# Patient Record
Sex: Female | Born: 1965 | Race: White | Hispanic: No | Marital: Married | Smoking: Former smoker
Health system: Southern US, Community
[De-identification: ages and names within clinical notes are randomized; demographics above are authoritative.]

## PROBLEM LIST (undated history)

## (undated) DIAGNOSIS — D594 Other nonautoimmune hemolytic anemias: Secondary | ICD-10-CM

## (undated) DIAGNOSIS — R002 Palpitations: Secondary | ICD-10-CM

## (undated) DIAGNOSIS — O039 Complete or unspecified spontaneous abortion without complication: Secondary | ICD-10-CM

## (undated) DIAGNOSIS — N938 Other specified abnormal uterine and vaginal bleeding: Secondary | ICD-10-CM

## (undated) DIAGNOSIS — F329 Major depressive disorder, single episode, unspecified: Secondary | ICD-10-CM

## (undated) DIAGNOSIS — F32A Depression, unspecified: Secondary | ICD-10-CM

## (undated) HISTORY — DX: Palpitations: R00.2

## (undated) HISTORY — DX: Complete or unspecified spontaneous abortion without complication: O03.9

## (undated) HISTORY — PX: OTHER SURGICAL HISTORY: SHX169

## (undated) HISTORY — DX: Other specified abnormal uterine and vaginal bleeding: N93.8

## (undated) HISTORY — DX: Major depressive disorder, single episode, unspecified: F32.9

## (undated) HISTORY — DX: Depression, unspecified: F32.A

## (undated) HISTORY — DX: Other nonautoimmune hemolytic anemias: D59.4

---

## 1998-08-24 ENCOUNTER — Other Ambulatory Visit: Admission: RE | Admit: 1998-08-24 | Discharge: 1998-08-24 | Payer: Self-pay | Admitting: Family Medicine

## 1999-03-10 ENCOUNTER — Encounter: Payer: Self-pay | Admitting: Family Medicine

## 1999-03-10 ENCOUNTER — Ambulatory Visit (HOSPITAL_COMMUNITY): Admission: RE | Admit: 1999-03-10 | Discharge: 1999-03-10 | Payer: Self-pay | Admitting: Family Medicine

## 1999-10-26 ENCOUNTER — Emergency Department (HOSPITAL_COMMUNITY): Admission: EM | Admit: 1999-10-26 | Discharge: 1999-10-26 | Payer: Self-pay | Admitting: Emergency Medicine

## 2000-03-23 ENCOUNTER — Other Ambulatory Visit: Admission: RE | Admit: 2000-03-23 | Discharge: 2000-03-23 | Payer: Self-pay | Admitting: Family Medicine

## 2001-03-25 ENCOUNTER — Other Ambulatory Visit: Admission: RE | Admit: 2001-03-25 | Discharge: 2001-04-13 | Payer: Self-pay

## 2002-04-29 ENCOUNTER — Other Ambulatory Visit: Admission: RE | Admit: 2002-04-29 | Discharge: 2002-04-29 | Payer: Self-pay | Admitting: Family Medicine

## 2002-12-08 ENCOUNTER — Other Ambulatory Visit: Admission: RE | Admit: 2002-12-08 | Discharge: 2002-12-08 | Payer: Self-pay | Admitting: Obstetrics and Gynecology

## 2003-07-16 ENCOUNTER — Other Ambulatory Visit: Admission: RE | Admit: 2003-07-16 | Discharge: 2003-07-16 | Payer: Self-pay | Admitting: Family Medicine

## 2003-07-21 ENCOUNTER — Encounter: Admission: RE | Admit: 2003-07-21 | Discharge: 2003-07-21 | Payer: Self-pay | Admitting: Family Medicine

## 2004-04-27 ENCOUNTER — Ambulatory Visit: Payer: Self-pay | Admitting: Family Medicine

## 2004-04-27 ENCOUNTER — Encounter: Admission: RE | Admit: 2004-04-27 | Discharge: 2004-04-27 | Payer: Self-pay | Admitting: Family Medicine

## 2004-05-19 ENCOUNTER — Ambulatory Visit: Payer: Self-pay | Admitting: Family Medicine

## 2004-07-04 ENCOUNTER — Ambulatory Visit: Payer: Self-pay | Admitting: Family Medicine

## 2004-07-11 ENCOUNTER — Other Ambulatory Visit: Admission: RE | Admit: 2004-07-11 | Discharge: 2004-07-11 | Payer: Self-pay | Admitting: Family Medicine

## 2004-07-11 ENCOUNTER — Ambulatory Visit: Payer: Self-pay | Admitting: Family Medicine

## 2004-07-13 ENCOUNTER — Encounter: Admission: RE | Admit: 2004-07-13 | Discharge: 2004-07-13 | Payer: Self-pay | Admitting: Family Medicine

## 2004-10-05 ENCOUNTER — Ambulatory Visit: Payer: Self-pay | Admitting: Family Medicine

## 2004-12-22 ENCOUNTER — Ambulatory Visit: Payer: Self-pay | Admitting: Family Medicine

## 2004-12-23 ENCOUNTER — Encounter: Admission: RE | Admit: 2004-12-23 | Discharge: 2004-12-23 | Payer: Self-pay | Admitting: Family Medicine

## 2004-12-29 ENCOUNTER — Ambulatory Visit: Payer: Self-pay | Admitting: Family Medicine

## 2005-01-13 ENCOUNTER — Encounter: Admission: RE | Admit: 2005-01-13 | Discharge: 2005-01-13 | Payer: Self-pay | Admitting: Family Medicine

## 2005-05-09 ENCOUNTER — Ambulatory Visit: Payer: Self-pay | Admitting: Family Medicine

## 2005-05-23 ENCOUNTER — Ambulatory Visit: Payer: Self-pay | Admitting: Family Medicine

## 2005-05-29 ENCOUNTER — Ambulatory Visit: Payer: Self-pay | Admitting: Licensed Clinical Social Worker

## 2005-06-13 ENCOUNTER — Ambulatory Visit: Payer: Self-pay | Admitting: Licensed Clinical Social Worker

## 2005-06-16 ENCOUNTER — Ambulatory Visit: Payer: Self-pay | Admitting: Family Medicine

## 2005-06-26 ENCOUNTER — Encounter: Admission: RE | Admit: 2005-06-26 | Discharge: 2005-06-26 | Payer: Self-pay | Admitting: Family Medicine

## 2005-07-31 ENCOUNTER — Ambulatory Visit: Payer: Self-pay | Admitting: Family Medicine

## 2005-08-03 ENCOUNTER — Ambulatory Visit: Payer: Self-pay | Admitting: Licensed Clinical Social Worker

## 2005-08-08 ENCOUNTER — Other Ambulatory Visit: Admission: RE | Admit: 2005-08-08 | Discharge: 2005-08-08 | Payer: Self-pay | Admitting: Family Medicine

## 2005-08-08 ENCOUNTER — Encounter: Payer: Self-pay | Admitting: Family Medicine

## 2005-08-08 ENCOUNTER — Ambulatory Visit: Payer: Self-pay | Admitting: Family Medicine

## 2005-11-15 ENCOUNTER — Ambulatory Visit: Payer: Self-pay | Admitting: Family Medicine

## 2005-11-17 ENCOUNTER — Encounter: Admission: RE | Admit: 2005-11-17 | Discharge: 2005-11-17 | Payer: Self-pay | Admitting: Family Medicine

## 2005-11-20 ENCOUNTER — Ambulatory Visit: Payer: Self-pay | Admitting: Family Medicine

## 2005-11-21 ENCOUNTER — Ambulatory Visit (HOSPITAL_COMMUNITY): Admission: RE | Admit: 2005-11-21 | Discharge: 2005-11-21 | Payer: Self-pay | Admitting: Family Medicine

## 2005-12-07 ENCOUNTER — Ambulatory Visit: Payer: Self-pay | Admitting: Internal Medicine

## 2006-03-01 ENCOUNTER — Ambulatory Visit: Payer: Self-pay | Admitting: Family Medicine

## 2006-06-21 ENCOUNTER — Ambulatory Visit: Payer: Self-pay | Admitting: Family Medicine

## 2006-07-02 ENCOUNTER — Encounter: Admission: RE | Admit: 2006-07-02 | Discharge: 2006-07-02 | Payer: Self-pay | Admitting: Family Medicine

## 2006-08-03 ENCOUNTER — Ambulatory Visit: Payer: Self-pay | Admitting: Family Medicine

## 2006-08-03 LAB — CONVERTED CEMR LAB
AST: 21 units/L (ref 0–37)
Albumin: 3.9 g/dL (ref 3.5–5.2)
BUN: 13 mg/dL (ref 6–23)
Calcium: 9.5 mg/dL (ref 8.4–10.5)
Chloride: 109 meq/L (ref 96–112)
Eosinophils Absolute: 0.1 10*3/uL (ref 0.0–0.6)
Eosinophils Relative: 1.7 % (ref 0.0–5.0)
GFR calc Af Amer: 143 mL/min
GFR calc non Af Amer: 118 mL/min
HCT: 36.8 % (ref 36.0–46.0)
HDL: 91 mg/dL (ref >39.0)
LDL Cholesterol: 97 mg/dL (ref 0–99)
Lymphocytes Relative: 22.8 % (ref 12.0–46.0)
MCHC: 34 g/dL (ref 30.0–36.0)
Monocytes Absolute: 0.5 10*3/uL (ref 0.2–0.7)
Monocytes Relative: 7.9 % (ref 3.0–11.0)
Neutro Abs: 4.2 10*3/uL (ref 1.4–7.7)
Neutrophils Relative %: 66.9 % (ref 43.0–77.0)
RBC: 3.79 M/uL — ABNORMAL LOW (ref 3.87–5.11)
RDW: 12.5 % (ref 11.5–14.6)
VLDL: 8 mg/dL (ref 0–40)
WBC: 6.2 10*3/uL (ref 4.5–10.5)

## 2006-08-10 ENCOUNTER — Other Ambulatory Visit: Admission: RE | Admit: 2006-08-10 | Discharge: 2006-08-10 | Payer: Self-pay | Admitting: Family Medicine

## 2006-08-10 ENCOUNTER — Ambulatory Visit: Payer: Self-pay | Admitting: Family Medicine

## 2006-08-10 ENCOUNTER — Encounter: Payer: Self-pay | Admitting: Family Medicine

## 2006-09-21 ENCOUNTER — Encounter: Payer: Self-pay | Admitting: Family Medicine

## 2006-12-10 ENCOUNTER — Telehealth: Payer: Self-pay | Admitting: Family Medicine

## 2006-12-24 DIAGNOSIS — O039 Complete or unspecified spontaneous abortion without complication: Secondary | ICD-10-CM

## 2006-12-24 HISTORY — DX: Complete or unspecified spontaneous abortion without complication: O03.9

## 2007-01-04 ENCOUNTER — Ambulatory Visit: Payer: Self-pay | Admitting: Family Medicine

## 2007-01-04 LAB — CONVERTED CEMR LAB: Beta hcg, urine, semiquantitative: POSITIVE

## 2007-02-05 ENCOUNTER — Telehealth: Payer: Self-pay | Admitting: Family Medicine

## 2007-02-08 ENCOUNTER — Ambulatory Visit: Payer: Self-pay | Admitting: Family Medicine

## 2007-02-08 DIAGNOSIS — F339 Major depressive disorder, recurrent, unspecified: Secondary | ICD-10-CM | POA: Insufficient documentation

## 2007-02-21 ENCOUNTER — Telehealth: Payer: Self-pay | Admitting: Family Medicine

## 2007-03-07 ENCOUNTER — Ambulatory Visit: Payer: Self-pay | Admitting: Family Medicine

## 2007-03-11 ENCOUNTER — Telehealth: Payer: Self-pay | Admitting: *Deleted

## 2007-03-11 ENCOUNTER — Ambulatory Visit: Payer: Self-pay | Admitting: Family Medicine

## 2007-03-11 ENCOUNTER — Telehealth (INDEPENDENT_AMBULATORY_CARE_PROVIDER_SITE_OTHER): Payer: Self-pay | Admitting: *Deleted

## 2007-03-11 DIAGNOSIS — F438 Other reactions to severe stress: Secondary | ICD-10-CM | POA: Insufficient documentation

## 2007-03-11 DIAGNOSIS — F4389 Other reactions to severe stress: Secondary | ICD-10-CM | POA: Insufficient documentation

## 2007-03-19 ENCOUNTER — Ambulatory Visit: Payer: Self-pay | Admitting: Family Medicine

## 2007-05-15 DIAGNOSIS — R071 Chest pain on breathing: Secondary | ICD-10-CM | POA: Insufficient documentation

## 2007-06-11 ENCOUNTER — Ambulatory Visit: Payer: Self-pay | Admitting: Family Medicine

## 2007-06-13 ENCOUNTER — Telehealth: Payer: Self-pay | Admitting: Family Medicine

## 2007-06-14 ENCOUNTER — Telehealth: Payer: Self-pay | Admitting: Family Medicine

## 2007-07-30 ENCOUNTER — Ambulatory Visit: Payer: Self-pay | Admitting: Family Medicine

## 2007-07-30 LAB — CONVERTED CEMR LAB
AST: 22 units/L (ref 0–37)
Albumin: 3.9 g/dL (ref 3.5–5.2)
Alkaline Phosphatase: 31 units/L — ABNORMAL LOW (ref 39–117)
CO2: 26 meq/L (ref 19–32)
Chloride: 103 meq/L (ref 96–112)
Eosinophils Absolute: 0.1 10*3/uL (ref 0.0–0.7)
GFR calc non Af Amer: 99 mL/min
Glucose, Bld: 79 mg/dL (ref 70–99)
LDL Cholesterol: 92 mg/dL (ref 0–99)
Lymphocytes Relative: 14 % (ref 12.0–46.0)
MCHC: 33.6 g/dL (ref 30.0–36.0)
MCV: 97.1 fL (ref 78.0–100.0)
Monocytes Absolute: 0.7 10*3/uL (ref 0.1–1.0)
Monocytes Relative: 6.2 % (ref 3.0–12.0)
Neutrophils Relative %: 78.8 % — ABNORMAL HIGH (ref 43.0–77.0)
Nitrite: NEGATIVE
Platelets: 332 10*3/uL (ref 150–400)
Protein, U semiquant: NEGATIVE
RBC: 3.93 M/uL (ref 3.87–5.11)
Sodium: 136 meq/L (ref 135–145)
TSH: 1.02 microintl units/mL (ref 0.35–5.50)
Total CHOL/HDL Ratio: 2.1
Urobilinogen, UA: 0.2
pH: 7

## 2007-08-06 ENCOUNTER — Ambulatory Visit: Payer: Self-pay | Admitting: Family Medicine

## 2007-08-06 DIAGNOSIS — F329 Major depressive disorder, single episode, unspecified: Secondary | ICD-10-CM

## 2007-08-06 DIAGNOSIS — N938 Other specified abnormal uterine and vaginal bleeding: Secondary | ICD-10-CM

## 2007-08-20 ENCOUNTER — Encounter: Admission: RE | Admit: 2007-08-20 | Discharge: 2007-08-20 | Payer: Self-pay | Admitting: Family Medicine

## 2007-09-24 DIAGNOSIS — R197 Diarrhea, unspecified: Secondary | ICD-10-CM

## 2007-09-25 ENCOUNTER — Ambulatory Visit: Payer: Self-pay | Admitting: Family Medicine

## 2007-09-25 DIAGNOSIS — R05 Cough: Secondary | ICD-10-CM

## 2007-10-23 ENCOUNTER — Telehealth: Payer: Self-pay | Admitting: Family Medicine

## 2007-11-04 ENCOUNTER — Telehealth: Payer: Self-pay | Admitting: *Deleted

## 2007-11-29 ENCOUNTER — Telehealth: Payer: Self-pay | Admitting: Family Medicine

## 2008-07-08 ENCOUNTER — Ambulatory Visit: Payer: Self-pay | Admitting: Family Medicine

## 2008-07-08 DIAGNOSIS — M545 Low back pain: Secondary | ICD-10-CM

## 2008-07-08 DIAGNOSIS — M546 Pain in thoracic spine: Secondary | ICD-10-CM

## 2008-07-08 LAB — CONVERTED CEMR LAB
Calcium: 9.8 mg/dL (ref 8.4–10.5)
Chloride: 104 meq/L (ref 96–112)
Creatinine, Ser: 0.7 mg/dL (ref 0.4–1.2)
Eosinophils Relative: 1.2 % (ref 0.0–5.0)
GFR calc non Af Amer: 97.8 mL/min (ref >60)
Glucose, Bld: 90 mg/dL (ref 70–99)
HCT: 38 % (ref 36.0–46.0)
Hemoglobin: 12.9 g/dL (ref 12.0–15.0)
Lymphocytes Relative: 22.9 % (ref 12.0–46.0)
MCHC: 34 g/dL (ref 30.0–36.0)
Monocytes Relative: 7.5 % (ref 3.0–12.0)
Neutro Abs: 4.9 10*3/uL (ref 1.4–7.7)
Platelets: 286 10*3/uL (ref 150.0–400.0)
RBC: 3.86 M/uL — ABNORMAL LOW (ref 3.87–5.11)
Sed Rate: 7 mm/hr (ref 0–22)
WBC: 7.1 10*3/uL (ref 4.5–10.5)

## 2008-07-09 ENCOUNTER — Telehealth: Payer: Self-pay | Admitting: *Deleted

## 2008-07-09 ENCOUNTER — Encounter: Payer: Self-pay | Admitting: Family Medicine

## 2008-07-10 ENCOUNTER — Ambulatory Visit: Payer: Self-pay | Admitting: Family Medicine

## 2008-07-11 DIAGNOSIS — M159 Polyosteoarthritis, unspecified: Secondary | ICD-10-CM | POA: Insufficient documentation

## 2008-07-30 ENCOUNTER — Telehealth: Payer: Self-pay | Admitting: *Deleted

## 2008-08-21 ENCOUNTER — Ambulatory Visit: Payer: Self-pay | Admitting: Family Medicine

## 2008-08-21 ENCOUNTER — Encounter: Admission: RE | Admit: 2008-08-21 | Discharge: 2008-08-21 | Payer: Self-pay | Admitting: Family Medicine

## 2008-08-21 LAB — CONVERTED CEMR LAB
ALT: 16 units/L (ref 0–35)
AST: 20 units/L (ref 0–37)
Albumin: 3.9 g/dL (ref 3.5–5.2)
BUN: 10 mg/dL (ref 6–23)
Basophils Absolute: 0 10*3/uL (ref 0.0–0.1)
Blood in Urine, dipstick: NEGATIVE
Calcium: 9.4 mg/dL (ref 8.4–10.5)
Chloride: 108 meq/L (ref 96–112)
Cholesterol: 224 mg/dL — ABNORMAL HIGH (ref 0–200)
Eosinophils Absolute: 0.1 10*3/uL (ref 0.0–0.7)
Eosinophils Relative: 0.7 % (ref 0.0–5.0)
Glucose, Bld: 89 mg/dL (ref 70–99)
Glucose, Urine, Semiquant: NEGATIVE
HDL: 109.2 mg/dL (ref >39.00)
Hemoglobin: 12.9 g/dL (ref 12.0–15.0)
Lymphs Abs: 1.6 10*3/uL (ref 0.7–4.0)
MCV: 100.5 fL — ABNORMAL HIGH (ref 78.0–100.0)
Monocytes Absolute: 0.5 10*3/uL (ref 0.1–1.0)
Monocytes Relative: 7.3 % (ref 3.0–12.0)
Nitrite: NEGATIVE
Potassium: 4 meq/L (ref 3.5–5.1)
Protein, U semiquant: NEGATIVE
RBC: 3.68 M/uL — ABNORMAL LOW (ref 3.87–5.11)
TSH: 1.21 microintl units/mL (ref 0.35–5.50)
Total Bilirubin: 1.1 mg/dL (ref 0.3–1.2)
Total CHOL/HDL Ratio: 2
Triglycerides: 47 mg/dL (ref 0.0–149.0)
VLDL: 9.4 mg/dL (ref 0.0–40.0)
WBC Urine, dipstick: NEGATIVE
pH: 7.5

## 2008-08-28 ENCOUNTER — Other Ambulatory Visit: Admission: RE | Admit: 2008-08-28 | Discharge: 2008-08-28 | Payer: Self-pay | Admitting: Family Medicine

## 2008-08-28 ENCOUNTER — Encounter: Payer: Self-pay | Admitting: Family Medicine

## 2008-08-28 ENCOUNTER — Ambulatory Visit: Payer: Self-pay | Admitting: Family Medicine

## 2008-09-01 ENCOUNTER — Telehealth: Payer: Self-pay | Admitting: Family Medicine

## 2009-04-21 ENCOUNTER — Telehealth: Payer: Self-pay | Admitting: Family Medicine

## 2009-08-23 ENCOUNTER — Ambulatory Visit: Payer: Self-pay | Admitting: Family Medicine

## 2009-08-23 ENCOUNTER — Encounter: Admission: RE | Admit: 2009-08-23 | Discharge: 2009-08-23 | Payer: Self-pay | Admitting: Family Medicine

## 2009-08-23 LAB — CONVERTED CEMR LAB
ALT: 12 units/L (ref 0–35)
Alkaline Phosphatase: 35 units/L — ABNORMAL LOW (ref 39–117)
Basophils Absolute: 0 10*3/uL (ref 0.0–0.1)
Basophils Relative: 0.5 % (ref 0.0–3.0)
Bilirubin Urine: NEGATIVE
Direct LDL: 101.1 mg/dL
Eosinophils Absolute: 0.1 10*3/uL (ref 0.0–0.7)
Eosinophils Relative: 1.7 % (ref 0.0–5.0)
GFR calc non Af Amer: 97.26 mL/min (ref >60)
Glucose, Urine, Semiquant: NEGATIVE
Ketones, urine, test strip: NEGATIVE
Lymphocytes Relative: 24.7 % (ref 12.0–46.0)
Lymphs Abs: 1.6 10*3/uL (ref 0.7–4.0)
MCHC: 34.6 g/dL (ref 30.0–36.0)
MCV: 98.9 fL (ref 78.0–100.0)
Monocytes Absolute: 0.6 10*3/uL (ref 0.1–1.0)
Monocytes Relative: 8.6 % (ref 3.0–12.0)
Neutro Abs: 4.3 10*3/uL (ref 1.4–7.7)
Nitrite: NEGATIVE
Platelets: 270 10*3/uL (ref 150.0–400.0)
TSH: 1.6 microintl units/mL (ref 0.35–5.50)
Total Bilirubin: 0.5 mg/dL (ref 0.3–1.2)
Total CHOL/HDL Ratio: 2
Total Protein: 6.7 g/dL (ref 6.0–8.3)
WBC Urine, dipstick: NEGATIVE
WBC: 6.6 10*3/uL (ref 4.5–10.5)
pH: 7

## 2009-08-23 LAB — HM MAMMOGRAPHY

## 2009-09-09 ENCOUNTER — Ambulatory Visit: Payer: Self-pay | Admitting: Family Medicine

## 2009-09-09 ENCOUNTER — Other Ambulatory Visit: Admission: RE | Admit: 2009-09-09 | Discharge: 2009-09-09 | Payer: Self-pay | Admitting: Family Medicine

## 2009-09-09 DIAGNOSIS — N943 Premenstrual tension syndrome: Secondary | ICD-10-CM | POA: Insufficient documentation

## 2009-10-22 ENCOUNTER — Encounter: Admission: RE | Admit: 2009-10-22 | Discharge: 2009-10-22 | Payer: Self-pay | Admitting: Neurological Surgery

## 2009-12-09 ENCOUNTER — Telehealth: Payer: Self-pay | Admitting: Family Medicine

## 2010-02-08 ENCOUNTER — Telehealth: Payer: Self-pay | Admitting: Family Medicine

## 2010-03-01 ENCOUNTER — Ambulatory Visit: Payer: Self-pay | Admitting: Family Medicine

## 2010-03-01 DIAGNOSIS — R209 Unspecified disturbances of skin sensation: Secondary | ICD-10-CM | POA: Insufficient documentation

## 2010-03-03 ENCOUNTER — Encounter: Admission: RE | Admit: 2010-03-03 | Discharge: 2010-03-03 | Payer: Self-pay | Admitting: Family Medicine

## 2010-04-12 ENCOUNTER — Telehealth: Payer: Self-pay | Admitting: Family Medicine

## 2010-04-12 ENCOUNTER — Ambulatory Visit: Payer: Self-pay | Admitting: Family Medicine

## 2010-05-26 NOTE — Assessment & Plan Note (Signed)
Summary: pain in both hands and soles of feet/cjr   Vital Signs:  Patient profile:   45 year old female Menstrual status:  regular Weight:      113 pounds Temp:     98.1 degrees F oral BP sitting:   110 / 72  (left arm) Cuff size:   regular  Vitals Entered By: Kern Reap CMA Duncan Dull) (March 01, 2010 4:06 PM) CC: pain with neck, hands, feet, muscle weakness   CC:  pain with neck, hands, feet, and muscle weakness.  History of Present Illness: selene is a 78 -year-old single female, nonsmoker, who comes in today for evaluation of some neurologic symptoms that have yet to be explained.  It started out with neck pain.  At that juncture, we were not sure what the cause was.  We sent her for neurologic evaluation.  Neurologic evaluation including MRI of her neck was normal.  She was then referred to two [facility].  Neurologic.  She had an evaluation by Dr. Si Gaul.......... including EMGs all of which were normal.  The neurologist has come to know.  Conclusion and basically has summarily discharged her.  However, she continues to have symptoms.  Her symptom complex, including pain in the soles of her feet and her hands tingling, weakness, muscle fasciculations.  All of which coming down.  Family history negative  Allergies: 1)  Flagyl (Metronidazole) 2)  Codeine Phosphate (Codeine Phosphate)  Past History:  Past medical, surgical, family and social histories (including risk factors) reviewed for relevance to current acute and chronic problems.  Past Medical History: Reviewed history from 08/06/2007 and no changes required. PALPATATIONS MHA DUB miscarriage September 2008 Depression  Family History: Reviewed history from 08/06/2007 and no changes required. father Alzheimer's disease mother died at 25, CVA hypertensive smoker CNS aneurysm, gallbladder disease brother and sister in good health  Social History: Reviewed history from 06/11/2007 and no changes required. Single Former  Smoker Alcohol use-no Drug use-no Regular exercise-yes runs without problem  Review of Systems      See HPI  Physical Exam  General:  Well-developed,well-nourished,in no acute distress; alert,appropriate and cooperative throughout examination Msk:  No deformity or scoliosis noted of thoracic or lumbar spine.   Pulses:  R and L carotid,radial,femoral,dorsalis pedis and posterior tibial pulses are full and equal bilaterally Extremities:  No clubbing, cyanosis, edema, or deformity noted with normal full range of motion of all joints.   Neurologic:  No cranial nerve deficits noted. Station and gait are normal. Plantar reflexes are down-going bilaterally. DTRs are symmetrical throughout. Sensory, motor and coordinative functions appear intact. Psych:  Cognition and judgment appear intact. Alert and cooperative with normal attention span and concentration. No apparent delusions, illusions, hallucinations   Problems:  Medical Problems Added: 1)  Dx of Symptom, Sensory Disturbance of Skin  (ICD-782.0)  Impression & Recommendations:  Problem # 1:  SYMPTOM,  SENSORY DISTURBANCE OF SKIN (ICD-782.0) Assessment Unchanged  Orders: Radiology Referral (Radiology) Neurology Referral (Neuro)  Complete Medication List: 1)  Daily Multi Tabs (Multiple vitamins-minerals) .... Once daily 2)  Hypercare 20 % Soln (Aluminum chloride) .... Apply daily or as needed 3)  Cryselle-28 0.3-30 Mg-mcg Tabs (Norgestrel-ethinyl estradiol) .... Take one tab by mouth once daily  Patient Instructions: 1)  since her symptoms have persisted.  I would recommend that we pursue this with a neurologic evaluation at [facility].  In the meantime, I will try to get u  set up for an MRI  of the brain   Orders Added: 1)  Est. Patient Level IV [[address]109] 2)  Radiology Referral [Radiology] 3)  Neurology Referral [Neuro]

## 2010-05-26 NOTE — Progress Notes (Signed)
Summary: wants rx called  Phone Note Call from Patient Call back at Home Phone [phone]   Caller: Patient---  voice mail Summary of Call: has a yeast infection with thick discharge but with no odor. Would like an oral rx [facility] to [facility] [facility] Initial call taken by: Warnell Forester,  February 08, 2010 9:13 AM  Follow-up for Phone Call        Diflucan 1 mg, dispensed, two tablets directions one, now, one in 5 days for vaginitis, no refills Follow-up by: Roderick Pee MD,  February 08, 2010 3:52 PM  Additional Follow-up for Phone Call Additional follow up Details #1::        pt called again with status.    Additional Follow-up for Phone Call Additional follow up Details #2::    left message on machine for patient  Follow-up by: Kern Reap CMA Duncan Dull),  February 08, 2010 5:13 PM  New/Updated Medications: DIFLUCAN 100 MG TABS (FLUCONAZOLE) take one tab now and one tab in five days Prescriptions: DIFLUCAN 100 MG TABS (FLUCONAZOLE) take one tab now and one tab in five days  #2 x 0   Entered by:   Kern Reap CMA (AAMA)   Authorized by:   Roderick Pee MD   Signed by:   Kern Reap CMA (AAMA) on 02/08/2010   Method used:   Electronically to        [facility]  [phone]* (retail)       [address]       [place], [place]  [address]       Ph: [phone]       Fax: [phone]   RxID:   [id number]

## 2010-05-26 NOTE — Assessment & Plan Note (Signed)
Summary: cpx w/pap/cjr   Vital Signs:  Patient profile:   45 year old female Menstrual status:  regular LMP:     08/25/2009 Height:      58.5 inches Weight:      112 pounds BMI:     23.09 Temp:     97.9 degrees F oral BP sitting:   110 / 70  (right arm)  Vitals Entered By: Kathrynn Speed CMA (Sep 09, 2009 3:49 PM) LMP (date): 08/25/2009     Enter LMP: 08/25/2009 Last PAP Result NEGATIVE FOR INTRAEPITHELIAL LESIONS OR MALIGNANCY.   History of Present Illness: Cynthia Delgado is a 45 year old single female, who comes in today for general physical examination  She's always been in excellent, health.  She's had no chronic health problems.  She is still complaining of neck pain and shoulder pain.  We did an evaluation last year, which included x-rays, which showed a bone spur on one of her cervical vertebrae.  She's tried anti-inflammatories massage therapy.  Nothing seems to help.  Will arrange for a consult with Dr. Danielle Dess.  Neurologic review of systems negative.  She uses Drysol for hyperhidrosis.  LMP 54, regular.  There, were she's having a lot of mood changes, prior to her period, and some mild hot flushes.  Tetanus boosted 2010  Preventive Screening-Counseling & Management  Alcohol-Tobacco     Alcohol drinks/day: 2     Smoking Status: quit  Caffeine-Diet-Exercise     Caffeine use/day: 1     Does Patient Exercise: yes  Current Medications (verified): 1)  Daily Multi  Tabs (Multiple Vitamins-Minerals) .... Once Daily 2)  Hypercare 20 % Soln (Aluminum Chloride) .... Apply Daily or As Needed  Allergies (verified): 1)  Flagyl (Metronidazole) 2)  Codeine Phosphate (Codeine Phosphate)  Past History:  Past medical, surgical, family and social histories (including risk factors) reviewed, and no changes noted (except as noted below).  Past Medical History: Reviewed history from 08/06/2007 and no changes required. PALPATATIONS MHA DUB miscarriage September  2008 Depression  Family History: Reviewed history from 08/06/2007 and no changes required. father Alzheimer's disease mother died at 1, CVA hypertensive smoker CNS aneurysm, gallbladder disease brother and sister in good health  Social History: Reviewed history from 06/11/2007 and no changes required. Single Former Smoker Alcohol use-no Drug use-no Regular exercise-yes runs without problem Caffeine use/day:  1  Review of Systems      See HPI  Physical Exam  General:  Well-developed,well-nourished,in no acute distress; alert,appropriate and cooperative throughout examination Head:  Normocephalic and atraumatic without obvious abnormalities. No apparent alopecia or balding. Eyes:  No corneal or conjunctival inflammation noted. EOMI. Perrla. Funduscopic exam benign, without hemorrhages, exudates or papilledema. Vision grossly normal. Ears:  External ear exam shows no significant lesions or deformities.  Otoscopic examination reveals clear canals, tympanic membranes are intact bilaterally without bulging, retraction, inflammation or discharge. Hearing is grossly normal bilaterally. Nose:  External nasal examination shows no deformity or inflammation. Nasal mucosa are pink and moist without lesions or exudates. Mouth:  Oral mucosa and oropharynx without lesions or exudates.  Teeth in good repair. Neck:  No deformities, masses, or tenderness noted. Chest Wall:  No deformities, masses, or tenderness noted. Breasts:  diffuse fibrocystic changes, mammogram recent normal Lungs:  Normal respiratory effort, chest expands symmetrically. Lungs are clear to auscultation, no crackles or wheezes. Heart:  Normal rate and regular rhythm. S1 and S2 normal without gallop, murmur, click, rub or other extra sounds. Abdomen:  Bowel sounds positive,abdomen soft  and non-tender without masses, organomegaly or hernias noted. Rectal:  No external abnormalities noted. Normal sphincter tone. No rectal masses or  tenderness. Genitalia:  Pelvic Exam:        External: normal female genitalia without lesions or masses        Vagina: normal without lesions or masses        Cervix: normal without lesions or masses        Adnexa: normal bimanual exam without masses or fullness        Uterus: normal by palpation        Pap smear: performed Msk:  No deformity or scoliosis noted of thoracic or lumbar spine.   Pulses:  R and L carotid,radial,femoral,dorsalis pedis and posterior tibial pulses are full and equal bilaterally Extremities:  No clubbing, cyanosis, edema, or deformity noted with normal full range of motion of all joints.   Neurologic:  No cranial nerve deficits noted. Station and gait are normal. Plantar reflexes are down-going bilaterally. DTRs are symmetrical throughout. Sensory, motor and coordinative functions appear intact. Skin:  Intact without suspicious lesions or rashes Cervical Nodes:  No lymphadenopathy noted Axillary Nodes:  No palpable lymphadenopathy Inguinal Nodes:  No significant adenopathy Psych:  Cognition and judgment appear intact. Alert and cooperative with normal attention span and concentration. No apparent delusions, illusions, hallucinations   Impression & Recommendations:  Problem # 1:  Preventive Health Care (ICD-V70.0) Assessment Unchanged  Problem # 2:  BACK PAIN, THORACIC REGION (ICD-724.1) Assessment: Deteriorated  The following medications were removed from the medication list:    Darvocet A500 100-500 Mg Tabs (Propoxyphene n-apap) .Marland Kitchen... 1 or 2 by mouth at bedtime as needed pain  Orders: Prescription Created Electronically [phone]) Neurosurgeon Referral (Neurosurgeon)  Problem # 3:  PMS (ICD-625.4) Assessment: New  The following medications were removed from the medication list:    Darvocet A500 100-500 Mg Tabs (Propoxyphene n-apap) .Marland Kitchen... 1 or 2 by mouth at bedtime as needed pain    Corgard 20 Mg Tabs (Nadolol) .Marland Kitchen... Take one tab once daily  Complete  Medication List: 1)  Daily Multi Tabs (Multiple vitamins-minerals) .... Once daily 2)  Hypercare 20 % Soln (Aluminum chloride) .... Apply daily or as needed 3)  Prozac 20 Mg Caps (Fluoxetine hcl) .Marland Kitchen.. 1 tab @ bedtime  Patient Instructions: 1)  I will call Dr. Danielle Dess to  is set up a  consult for you 2)  Please schedule a follow-up appointment in 1 year. 3)  try Prozac, 20 mg nightly for 10 days prior to your. To help with the PMS symptoms Prescriptions: PROZAC 20 MG CAPS (FLUOXETINE HCL) 1 tab @ bedtime  #50 x 4   Entered and Authorized by:   Roderick Pee MD   Signed by:   Roderick Pee MD on 09/09/2009   Method used:   Electronically to        [facility]  [phone]* (retail)       [address]       [place], [place]  [address]       Ph: [phone]       Fax: [phone]   RxID:   [id number] HYPERCARE 20 % SOLN (ALUMINUM CHLORIDE) apply daily or as needed  #60cc x 6   Entered and Authorized by:   Roderick Pee MD   Signed by:   Roderick Pee MD on 09/09/2009   Method used:   Electronically to        [facility]  [phone]* (retail)  [address]       [place], [place]  [address]       Ph: [phone]       Fax: [phone]   RxID:   [id number]

## 2010-05-26 NOTE — Progress Notes (Signed)
  Phone Note Call from Patient   Caller: Patient Call For: Roderick Pee MD Summary of Call: Pt is calling back to talk to Dr. Tawanna Cooler about a spinal tap. [phone] Initial call taken by: Lynann Beaver CMA AAMA,  April 12, 2010 4:34 PM  Follow-up for Phone Call        Fleet Contras please call her tomorrow.......... whatever the the neurologist recommends that I would follow their advice since they are the experts in this area Follow-up by: Roderick Pee MD,  April 12, 2010 5:09 PM  Additional Follow-up for Phone Call Additional follow up Details #1::        Phone Call Completed Additional Follow-up by: Kern Reap CMA Duncan Dull),  April 13, 2010 1:59 PM

## 2010-05-26 NOTE — Progress Notes (Signed)
Summary: OCP  Phone Note Call from Patient   Caller: Patient Call For: Roderick Pee MD Summary of Call: Pt is asking for RX for OCP.  [facility]) [phone] Initial call taken by: Lynann Beaver CMA,  December 09, 2009 1:38 PM  Follow-up for Phone Call        rx sent and left message on machine for patient to take a asa daily  Follow-up by: Kern Reap CMA Duncan Dull),  December 09, 2009 2:42 PM    New/Updated Medications: CRYSELLE-28 0.3-30 MG-MCG TABS (NORGESTREL-ETHINYL ESTRADIOL) take one tab by mouth once daily Prescriptions: CRYSELLE-28 0.3-30 MG-MCG TABS (NORGESTREL-ETHINYL ESTRADIOL) take one tab by mouth once daily  #1 x 9   Entered by:   Kern Reap CMA (AAMA)   Authorized by:   Roderick Pee MD   Signed by:   Kern Reap CMA (AAMA) on 12/09/2009   Method used:   Electronically to        [facility]  [phone]* (retail)       [address]       [place], [place]  [address]       Ph: [phone]       Fax: [phone]   RxID:   [id number]

## 2010-05-26 NOTE — Assessment & Plan Note (Signed)
Summary: hands tingling/dm   Vital Signs:  Patient profile:   45 year old female Menstrual status:  regular Weight:      112 pounds Temp:     98.3 degrees F oral BP sitting:   104 / 70  (left arm) Cuff size:   regular  Vitals Entered By: Kern Reap CMA Duncan Dull) (April 12, 2010 12:42 PM) CC: follow-up visit, increased symptoms   CC:  follow-up visit and increased symptoms.  History of Present Illness: Cynthia Delgado is a 45 year old single female, nonsmoker, who comes in today for reevaluation of the ongoing neurologic symptoms, for which we neurology and neurosurgery have not come up with a definitive diagnosis.  Historically, she said, trouble with her back at for about 6 years, however, that tends to be lumbar disk pain.  Will come and go.  She says some epidural steroid injections.  She said some physical therapy, but that has been intermittent ongoing problem.  However, last spring, she developed some symptoms of numbness and tingling in her feet and hands.  A sensation of weakness and joint pain, mainly in her thumbs.  Evaluation by me it was negative.  Neurosurgical evaluation and consultation were unrevealing as was a neurologic exam.  EMG etc. all normal.  Her symptoms have progressed and seemed to be getting worse.  They don't wake her up at night.  She is able to function at work.  She does have an appointment to see a neurologist at [facility], but not until March.  Review of systems negative.  She only takes her BCPs.  LMP two weeks ago, normal  Allergies: 1)  Flagyl (Metronidazole) 2)  Codeine Phosphate (Codeine Phosphate)  Past History:  Past medical, surgical, family and social histories (including risk factors) reviewed, and no changes noted (except as noted below).  Past Medical History: Reviewed history from 08/06/2007 and no changes required. PALPATATIONS MHA DUB miscarriage September 2008 Depression  Family History: Reviewed history from 08/06/2007 and no  changes required. father Alzheimer's disease mother died at 51, CVA hypertensive smoker CNS aneurysm, gallbladder disease brother and sister in good health  Social History: Reviewed history from 06/11/2007 and no changes required. Single Former Smoker Alcohol use-no Drug use-no Regular exercise-yes runs without problem  Review of Systems      See HPI  Physical Exam  General:  Well-developed,well-nourished,in no acute distress; alert,appropriate and cooperative throughout examination Head:  Normocephalic and atraumatic without obvious abnormalities. No apparent alopecia or balding. Eyes:  No corneal or conjunctival inflammation noted. EOMI. Perrla. Funduscopic exam benign, without hemorrhages, exudates or papilledema. Vision grossly normal. Ears:  External ear exam shows no significant lesions or deformities.  Otoscopic examination reveals clear canals, tympanic membranes are intact bilaterally without bulging, retraction, inflammation or discharge. Hearing is grossly normal bilaterally. Nose:  External nasal examination shows no deformity or inflammation. Nasal mucosa are pink and moist without lesions or exudates. Mouth:  Oral mucosa and oropharynx without lesions or exudates.  Teeth in good repair. Neck:  No deformities, masses, or tenderness noted. Chest Wall:  No deformities, masses, or tenderness noted. Lungs:  Normal respiratory effort, chest expands symmetrically. Lungs are clear to auscultation, no crackles or wheezes. Heart:  Normal rate and regular rhythm. S1 and S2 normal without gallop, murmur, click, rub or other extra sounds. Abdomen:  Bowel sounds positive,abdomen soft and non-tender without masses, organomegaly or hernias noted. Msk:  No deformity or scoliosis noted of thoracic or lumbar spine.   Pulses:  R and L carotid,radial,femoral,dorsalis  pedis and posterior tibial pulses are full and equal bilaterally Extremities:  No clubbing, cyanosis, edema, or deformity noted  with normal full range of motion of all joints.   Neurologic:  No cranial nerve deficits noted. Station and gait are normal. Plantar reflexes are down-going bilaterally. DTRs are symmetrical throughout. Sensory, motor and coordinative functions appear intact. Skin:  Intact without suspicious lesions or rashes Cervical Nodes:  No lymphadenopathy noted Axillary Nodes:  No palpable lymphadenopathy Inguinal Nodes:  No significant adenopathy Psych:  Cognition and judgment appear intact. Alert and cooperative with normal attention span and concentration. No apparent delusions, illusions, hallucinations   Impression & Recommendations:  Problem # 1:  SYMPTOM,  SENSORY DISTURBANCE OF SKIN (ICD-782.0) Assessment Deteriorated  Complete Medication List: 1)  Daily Multi Tabs (Multiple vitamins-minerals) .... Once daily 2)  Hypercare 20 % Soln (Aluminum chloride) .... Apply daily or as needed 3)  Cryselle-28 0.3-30 Mg-mcg Tabs (Norgestrel-ethinyl estradiol) .... Take one tab by mouth once daily  Patient Instructions: 1)  I will call the neurologist, and the neurosurgeon to get a complete idea of all the studies.  They have done and then I will call you and would discuss where we  gofrom here   Orders Added: 1)  Est. Patient Level IV [[address]]

## 2010-08-15 ENCOUNTER — Other Ambulatory Visit: Payer: Self-pay | Admitting: Family Medicine

## 2010-08-15 DIAGNOSIS — Z1231 Encounter for screening mammogram for malignant neoplasm of breast: Secondary | ICD-10-CM

## 2010-08-30 ENCOUNTER — Ambulatory Visit: Payer: Self-pay

## 2010-08-31 ENCOUNTER — Ambulatory Visit: Payer: Self-pay

## 2010-09-06 ENCOUNTER — Ambulatory Visit
Admission: RE | Admit: 2010-09-06 | Discharge: 2010-09-06 | Disposition: A | Discharge status: Home / Self Care | Payer: BC Managed Care – PPO | Source: Ambulatory Visit | Attending: Family Medicine | Admitting: Family Medicine

## 2010-09-06 DIAGNOSIS — Z1231 Encounter for screening mammogram for malignant neoplasm of breast: Secondary | ICD-10-CM

## 2010-09-09 NOTE — Assessment & Plan Note (Signed)
[facility]                           GASTROENTEROLOGY OFFICE NOTE   Cynthia Delgado, Cynthia Delgado                       MRN:          [id number]  DATE:12/07/2005                            DOB:          [birth year]    REQUESTING PHYSICIAN:  Eugenio Hoes. Tawanna Cooler, MD   REASON FOR CONSULTATION:  Abdominal pain.   ASSESSMENT:  1. Vague right upper quadrant pain not characteristic of any particular      etiology by history.  Ultrasound has been negative in 2000 and 2007      (recently), i.e., no cholelithiasis.  A recent HIDA scan with ejection      fraction is unremarkable.  CBC and CMET have been unrevealing      previously.  She has gained a little bit of weight.  Perhaps there is a      pressure phenomenon from that.  Dyspepsia is possible.  Ulcer disease      does not seem likely.  There were no worrisome features.   PLAN:  1. Reassurance.  2. Hepatic function panel, amylase and lipase.  3. Trial of Zegerid 40 mg each day.  4. Follow-up in four to six weeks for reassessment.   1. She drinks regularly, probably averaging 2 glasses of wine a night at      least.  In the past few months when she was undergoing some depression,      she was drinking more heavily but has backed off.  She has completed      Celexa for depression and feels like that has resolved.  She is      cautioned about steady, regular alcohol consumption in this fashion as      it could lead to significant liver disease, though I do not think any      is present at this time.   HISTORY:  A 45 year old white woman who has had about a 10-week history of a  vague, pressure-like sensation in the upper abdomen.  It has been present  for about 10 weeks.  It can radiate to the side and back.  It is not  associated with defecation, food ingestion, urination.  She has gained about  10-12 pounds in the spring when she was on Celexa.  That has been  discontinued.  Drinking as above.  She had not  noticed an association with  that.  She feels a pressure there all the time but she is able to sleep.  GI  review of systems is otherwise negative though she does feel bloated.   PAST MEDICAL HISTORY:  1. Palpitations/dysrhythmia.  2. Depression.   MEDICATIONS:  1. Multivitamin.  2. Birth control.   DRUG ALLERGIES:  CODEINE.   FAMILY HISTORY:  Her mother had gallbladder problems.  There is no colon  cancer or other GI problems reported.   SOCIAL HISTORY:  She is single.  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  She lives alone.  Alcohol as above. No tobacco or drugs.   REVIEW OF SYSTEMS:  See medical history form for other details.  She has  some back pain and has seen Dr. Ethelene Hal for that.  The back pain is lumbar.   PHYSICAL EXAMINATION:  GENERAL:  A pleasant, petite white woman in no acute  distress.  VITAL SIGNS:  Height 4 feet 10 inches, weight 110 pounds, blood pressure  100/64, pulse 52.  HEENT:  The eyes are anicteric.  ENT:  Normal mouth and posterior pharynx.  NECK:  Supple with no thyromegaly or mass.  CHEST:  Clear.  CARDIAC:  S1, S2, no murmurs, rubs or gallops.  ABDOMEN:  Soft, perhaps some minimal right upper quadrant tenderness but is  otherwise benign without organomegaly or mass.  EXTREMITIES:  No edema.  SKIN:  Tanned.  There is no acute rash.  LYMPHATIC:  No neck or supraclavicular nodes.   LABORATORY DATA:  CBC April 9 was normal.  CMET normal except for an  alkaline phosphatase that was low at 37.  Her lipids were normal on April 9  as well.  TSH normal.   Ultrasound findings and HIDA scan findings as described above.   I appreciate the opportunity to care for this patient.                                   Iva Boop, MD, [facility]   CEG/MedQ  DD:  12/07/2005  DT:  12/08/2005  Job #:  [id number]   cc:   Tinnie Gens A. Tawanna Cooler, MD

## 2010-09-20 ENCOUNTER — Other Ambulatory Visit (INDEPENDENT_AMBULATORY_CARE_PROVIDER_SITE_OTHER): Payer: BC Managed Care – PPO

## 2010-09-20 DIAGNOSIS — Z0000 Encounter for general adult medical examination without abnormal findings: Secondary | ICD-10-CM

## 2010-09-20 LAB — POCT URINALYSIS DIPSTICK
Blood, UA: NEGATIVE
Protein, UA: NEGATIVE
Spec Grav, UA: 1.005
Urobilinogen, UA: 0.2

## 2010-09-20 LAB — HEPATIC FUNCTION PANEL
AST: 22 U/L (ref 0–37)
Total Bilirubin: 0.5 mg/dL (ref 0.3–1.2)

## 2010-09-20 LAB — BASIC METABOLIC PANEL
BUN: 13 mg/dL (ref 6–23)
Creatinine, Ser: 0.7 mg/dL (ref 0.4–1.2)
GFR: 93.68 mL/min (ref >60.00)
Glucose, Bld: 85 mg/dL (ref 70–99)
Potassium: 5 mEq/L (ref 3.5–5.1)

## 2010-09-20 LAB — CBC WITH DIFFERENTIAL/PLATELET
Basophils Absolute: 0 10*3/uL (ref 0.0–0.1)
HCT: 38.8 % (ref 36.0–46.0)
Lymphs Abs: 1.5 10*3/uL (ref 0.7–4.0)
Monocytes Absolute: 0.5 10*3/uL (ref 0.1–1.0)
Monocytes Relative: 7.9 % (ref 3.0–12.0)
Platelets: 290 10*3/uL (ref 150.0–400.0)
RDW: 13.9 % (ref 11.5–14.6)

## 2010-09-20 LAB — LIPID PANEL
Cholesterol: 193 mg/dL (ref 0–200)
LDL Cholesterol: 100 mg/dL — ABNORMAL HIGH (ref 0–99)
Triglycerides: 59 mg/dL (ref 0.0–149.0)
VLDL: 11.8 mg/dL (ref 0.0–40.0)

## 2010-09-23 ENCOUNTER — Other Ambulatory Visit: Payer: Self-pay | Admitting: Family Medicine

## 2010-09-26 ENCOUNTER — Encounter: Payer: Self-pay | Admitting: Family Medicine

## 2010-09-27 ENCOUNTER — Other Ambulatory Visit (HOSPITAL_COMMUNITY)
Admission: RE | Admit: 2010-09-27 | Discharge: 2010-09-27 | Disposition: A | Discharge status: Home / Self Care | Payer: BC Managed Care – PPO | Source: Ambulatory Visit | Attending: Family Medicine | Admitting: Family Medicine

## 2010-09-27 ENCOUNTER — Ambulatory Visit (INDEPENDENT_AMBULATORY_CARE_PROVIDER_SITE_OTHER): Payer: BC Managed Care – PPO | Admitting: Family Medicine

## 2010-09-27 ENCOUNTER — Encounter: Payer: Self-pay | Admitting: Family Medicine

## 2010-09-27 DIAGNOSIS — N6019 Diffuse cystic mastopathy of unspecified breast: Secondary | ICD-10-CM

## 2010-09-27 DIAGNOSIS — E039 Hypothyroidism, unspecified: Secondary | ICD-10-CM

## 2010-09-27 DIAGNOSIS — Z01419 Encounter for gynecological examination (general) (routine) without abnormal findings: Secondary | ICD-10-CM

## 2010-09-27 DIAGNOSIS — I499 Cardiac arrhythmia, unspecified: Secondary | ICD-10-CM

## 2010-09-27 DIAGNOSIS — N943 Premenstrual tension syndrome: Secondary | ICD-10-CM

## 2010-09-27 MED ORDER — ALUMINUM CHLORIDE 20 % EX SOLN: Freq: Every day | CUTANEOUS | Status: DC | PRN

## 2010-09-27 MED ORDER — NADOLOL 20 MG PO TABS: 20.0000 mg | ORAL_TABLET | Freq: Every day | ORAL | Status: DC

## 2010-09-27 MED ORDER — ETHYNODIOL DIAC-ETH ESTRADIOL 1-50 MG-MCG PO TABS: 1.0000 | ORAL_TABLET | Freq: Every day | ORAL | Status: DC

## 2010-09-27 NOTE — Progress Notes (Signed)
  Subjective:    Patient ID: Cynthia Delgado Reason, female    DOB: 07/12/1965, 45 y.o.   MRN: [id number]  HPI  Selene Is a delightful, 45 year old single female, nonsmoker, who comes in for physical examination because of worsening perimenopausal symptoms, rapid heart rate,  She is currently on low dose BCPs however, she is having breakthrough symptoms of early menopause with dry skin.  Dry.  Vagina, hot flashes and mood changes.  We discussed various options will increase the dose of her BCPs for now.  She saw 7 recurrent episodes of cardiac arrhythmia.  In the past.  We treated it with Corgard 20 mg daily, which abated the arrhythmia.  She stopped it and skipping his back.  It can occur up to 10 times per day.  Only lasts for short periods of time.  No cardiac symptoms.  Previously noted PVCs.  EKG pending.  Mother died at age 12 of a stroke from underlying hypertension.  Sister went through early menopause.  She is currently being followed at [facility] for cervical disk disease    Review of Systems  Constitutional: Negative.   HENT: Negative.   Eyes: Negative.   Respiratory: Negative.   Cardiovascular: Negative.   Gastrointestinal: Negative.   Genitourinary: Negative.   Musculoskeletal: Negative.   Neurological: Negative.   Hematological: Negative.   Psychiatric/Behavioral: Negative.        Objective:   Physical Exam  Constitutional: She appears well-developed and well-nourished.  HENT:  Head: Normocephalic and atraumatic.  Right Ear: External ear normal.  Left Ear: External ear normal.  Nose: Nose normal.  Mouth/Throat: Oropharynx is clear and moist.  Eyes: EOM are normal. Pupils are equal, round, and reactive to light.  Neck: Normal range of motion. Neck supple. No thyromegaly present.  Cardiovascular: Normal rate, regular rhythm, normal heart sounds and intact distal pulses.  Exam reveals no gallop and no friction rub.   No murmur  heard. Pulmonary/Chest: Effort normal and breath sounds normal.  Abdominal: Soft. Bowel sounds are normal. She exhibits no distension and no mass. There is no tenderness. There is no rebound.  Genitourinary: Vagina normal and uterus normal. Guaiac negative stool. No vaginal discharge found.       Bilateral breast exam shows multiple fibrocystic-type lesions throughout both breasts.  No dominant lesion  Musculoskeletal: Normal range of motion.  Lymphadenopathy:    She has no cervical adenopathy.  Neurological: She is alert. She has normal reflexes. No cranial nerve deficit. She exhibits normal muscle tone. Coordination normal.  Skin: Skin is warm and dry.  Psychiatric: She has a normal mood and affect. Her behavior is normal. Judgment and thought content normal.          Assessment & Plan:  ,Premature menopause,,,,,,, increase BCPs, taken continuously follow-up in two months.  PVCs.  Restart Corgard 20 mg daily.  Cervical disk disease.  Follow-up in [facility]

## 2010-09-27 NOTE — Patient Instructions (Signed)
Let's increase the dose of the BCPs to see if we can decrease  y  symptoms.  Take one daily for 21 days, then take the first pill of the next pack on day 22.  That way.  y will  only have a period every 63 days.  Restart the Corgard 20 mg daily.  Follow-up in 4 weeks

## 2010-10-03 NOTE — Progress Notes (Signed)
Addended by: Haze Boyden on: 10/03/2010 01:27 PM   Modules accepted: Orders

## 2010-10-03 NOTE — ED Provider Notes (Signed)
Order(s) created erroneously. Erroneous order ID: [id number] Order moved by: GRAVES, Cheryl Stabenow Order move date/time: 10/03/2010  1:04 PM Source Patient:    [id number] Source Contact: 09/27/2010 Destination Patient:    [id number] Destination Contact: 09/27/2010

## 2010-10-27 ENCOUNTER — Ambulatory Visit: Payer: BC Managed Care – PPO | Admitting: Family Medicine

## 2010-11-01 ENCOUNTER — Encounter: Payer: Self-pay | Admitting: Family Medicine

## 2011-02-21 ENCOUNTER — Telehealth: Payer: Self-pay | Admitting: *Deleted

## 2011-02-21 NOTE — Telephone Encounter (Addendum)
Pt. Has started back having palpitations on the Corgard 20 mg. X one month.  Asking for suggestions. Correction made from Crestor to Corgard.

## 2011-02-21 NOTE — Telephone Encounter (Signed)
Spoke with patient.

## 2011-02-21 NOTE — Telephone Encounter (Signed)
Cynthia Delgado please call........... She is on Corgard, not Crestor.........Marland Kitchen Recommend she take a full tablet at bedtime, and a half a tablet in the morning and if this is not control her symptoms.  She needs to come in for an office visit

## 2011-04-10 ENCOUNTER — Ambulatory Visit: Payer: BC Managed Care – PPO | Admitting: Family Medicine

## 2011-04-10 ENCOUNTER — Telehealth: Payer: Self-pay

## 2011-04-10 NOTE — Telephone Encounter (Signed)
Spoke with patient and she is unable to come in until tomorrow.  Appointment made.

## 2011-04-10 NOTE — Telephone Encounter (Signed)
Pt states she was seen in June and told Dr. Tawanna Cooler about her heart skipping beats. Pt states she was put on Corgard. Per pt after a while the medication stopped working and pt's dose was increased.  Per pt she is beginning to have increased skipping and would like to be seen. Pls advise

## 2011-04-10 NOTE — Telephone Encounter (Signed)
Okay 4:30.  Left message on machine for patient.

## 2011-04-11 ENCOUNTER — Encounter: Payer: Self-pay | Admitting: Family Medicine

## 2011-04-11 ENCOUNTER — Ambulatory Visit (INDEPENDENT_AMBULATORY_CARE_PROVIDER_SITE_OTHER): Payer: BC Managed Care – PPO | Admitting: Family Medicine

## 2011-04-11 VITALS — BP 102/68 | Temp 98.0°F | Wt 117.0 lb

## 2011-04-11 DIAGNOSIS — I499 Cardiac arrhythmia, unspecified: Secondary | ICD-10-CM

## 2011-04-11 MED ORDER — NADOLOL 20 MG PO TABS: ORAL_TABLET | ORAL | Status: DC

## 2011-04-11 NOTE — Patient Instructions (Signed)
Increase the Corgard, and take one twice daily.  We will set you up for a cardiac evaluation

## 2011-04-11 NOTE — Progress Notes (Signed)
  Subjective:    Patient ID: Cynthia Delgado, female    DOB: Mar 18, 1966, 45 y.o.   MRN: [id number]  HPI Cynthia Delgado  Is a 45 year old single female, nonsmoker, who comes in today for evaluation of palpitations,  She's had episodes of rapid heart rate off and on for the past 9 years.  Would never determine an etiology.  We've gone on a caffeine free diet, which has not helped.Initially, on 20 mg of Corgard, her symptoms were minimal, however, about 6 weeks ago.  They increased.  We therefore increased her Corgard to 30 mg daily, however, she still having episodes of rapid heart rate.  No chest pain.  She is able to exercise on a regular basis without any discomfort   Review of Systems    Cardiopulmonary is systems negative Objective:   Physical Exam Well-developed well-nourished, female, in no acute distress.  Examination of the heart shows the PMI to be in the fifth intercostal space in the midclavicular line.  No heaves or thrills.  S1, S2, within normal limits.  S2 is physiologically split.  I can appreciate no murmur, rubs, or gallops.  Heart rate nail is 70 and regular       Assessment & Plan:  Episodes of rapid heart rate.  Plan increase beta-blocker to 20 mg b.i.d. Holter monitor

## 2011-04-12 ENCOUNTER — Other Ambulatory Visit: Payer: Self-pay | Admitting: Family Medicine

## 2011-04-12 DIAGNOSIS — I499 Cardiac arrhythmia, unspecified: Secondary | ICD-10-CM

## 2011-04-21 ENCOUNTER — Encounter (INDEPENDENT_AMBULATORY_CARE_PROVIDER_SITE_OTHER): Payer: 59

## 2011-04-21 DIAGNOSIS — I499 Cardiac arrhythmia, unspecified: Secondary | ICD-10-CM

## 2011-04-21 DIAGNOSIS — R002 Palpitations: Secondary | ICD-10-CM

## 2011-05-09 ENCOUNTER — Telehealth: Payer: Self-pay | Admitting: *Deleted

## 2011-05-09 NOTE — Telephone Encounter (Signed)
Pt would like Holter Monitor results, please.

## 2011-05-09 NOTE — Telephone Encounter (Signed)
Patient is aware 

## 2011-05-09 NOTE — Telephone Encounter (Signed)
Cynthia Delgado please call her monitor showed a very infrequent PACs and which is not clinically significant.  However, going forward.  She may notice an occasional skip.  The medications to avoid that are caffeine and if the skipping gets worse we can always put her on medication, but, since it's not a significant problem.  I would not recommend any medication at this time

## 2011-06-30 ENCOUNTER — Ambulatory Visit: Payer: 59 | Admitting: Family

## 2011-06-30 ENCOUNTER — Other Ambulatory Visit: Payer: Self-pay | Admitting: Family

## 2011-06-30 ENCOUNTER — Ambulatory Visit (INDEPENDENT_AMBULATORY_CARE_PROVIDER_SITE_OTHER): Payer: 59 | Admitting: Family

## 2011-06-30 ENCOUNTER — Encounter: Payer: Self-pay | Admitting: Family

## 2011-06-30 VITALS — BP 108/60 | Temp 98.6°F | Wt 116.0 lb

## 2011-06-30 DIAGNOSIS — A499 Bacterial infection, unspecified: Secondary | ICD-10-CM

## 2011-06-30 DIAGNOSIS — N760 Acute vaginitis: Secondary | ICD-10-CM

## 2011-06-30 DIAGNOSIS — B9689 Other specified bacterial agents as the cause of diseases classified elsewhere: Secondary | ICD-10-CM

## 2011-06-30 DIAGNOSIS — N898 Other specified noninflammatory disorders of vagina: Secondary | ICD-10-CM

## 2011-06-30 MED ORDER — CLINDAMYCIN HCL 300 MG PO CAPS: 300.0000 mg | ORAL_CAPSULE | Freq: Two times a day (BID) | ORAL | Status: AC

## 2011-06-30 NOTE — Patient Instructions (Signed)
Bacterial Vaginosis Bacterial vaginosis (BV) is a vaginal infection where the normal balance of bacteria in the vagina is disrupted. The normal balance is then replaced by an overgrowth of certain bacteria. There are several different kinds of bacteria that can cause BV. BV is the most common vaginal infection in women of childbearing age. CAUSES   The cause of BV is not fully understood. BV develops when there is an increase or imbalance of harmful bacteria.   Some activities or behaviors can upset the normal balance of bacteria in the vagina and put women at increased risk including:   Having a new sex partner or multiple sex partners.   Douching.   Using an intrauterine device (IUD) for contraception.   It is not clear what role sexual activity plays in the development of BV. However, women that have never had sexual intercourse are rarely infected with BV.  Women do not get BV from toilet seats, bedding, swimming pools or from touching objects around them.  SYMPTOMS   Grey vaginal discharge.   A fish-like odor with discharge, especially after sexual intercourse.   Itching or burning of the vagina and vulva.   Burning or pain with urination.   Some women have no signs or symptoms at all.  DIAGNOSIS  Your caregiver must examine the vagina for signs of BV. Your caregiver will perform lab tests and look at the sample of vaginal fluid through a microscope. They will look for bacteria and abnormal cells (clue cells), a pH test higher than 4.5, and a positive amine test all associated with BV.  RISKS AND COMPLICATIONS   Pelvic inflammatory disease (PID).   Infections following gynecology surgery.   Developing HIV.   Developing herpes virus.  TREATMENT  Sometimes BV will clear up without treatment. However, all women with symptoms of BV should be treated to avoid complications, especially if gynecology surgery is planned. Female partners generally do not need to be treated. However,  BV may spread between female sex partners so treatment is helpful in preventing a recurrence of BV.   BV may be treated with antibiotics. The antibiotics come in either pill or vaginal cream forms. Either can be used with nonpregnant or pregnant women, but the recommended dosages differ. These antibiotics are not harmful to the baby.   BV can recur after treatment. If this happens, a second round of antibiotics will often be prescribed.   Treatment is important for pregnant women. If not treated, BV can cause a premature delivery, especially for a pregnant woman who had a premature birth in the past. All pregnant women who have symptoms of BV should be checked and treated.   For chronic reoccurrence of BV, treatment with a type of prescribed gel vaginally twice a week is helpful.  HOME CARE INSTRUCTIONS   Finish all medication as directed by your caregiver.   Do not have sex until treatment is completed.   Tell your sexual partner that you have a vaginal infection. They should see their caregiver and be treated if they have problems, such as a mild rash or itching.   Practice safe sex. Use condoms. Only have 1 sex partner.  PREVENTION  Basic prevention steps can help reduce the risk of upsetting the natural balance of bacteria in the vagina and developing BV:  Do not have sexual intercourse (be abstinent).   Do not douche.   Use all of the medicine prescribed for treatment of BV, even if the signs and symptoms go away.     Tell your sex partner if you have BV. That way, they can be treated, if needed, to prevent reoccurrence.  SEEK MEDICAL CARE IF:   Your symptoms are not improving after 3 days of treatment.   You have increased discharge, pain, or fever.  MAKE SURE YOU:   Understand these instructions.   Will watch your condition.   Will get help right away if you are not doing well or get worse.  FOR MORE INFORMATION  Division of STD Prevention (DSTDP), Centers for Disease  Control and Prevention: www.cdc.gov/std American Social Health Association (ASHA): www.ashastd.org  Document Released: 04/10/2005 Document Revised: 03/30/2011 Document Reviewed: 10/01/2008 Exit[employer] Patient Information 2012 [employer]. 

## 2011-06-30 NOTE — Progress Notes (Signed)
  Subjective:    Patient ID: Cynthia Delgado, female    DOB: [birth year], 46 y.o.   MRN: [id number]  HPI 46 year old white female, nonsmoker, patient of Dr. Tawanna Cooler is in today with complaints of vaginal discharge has been going on for about 3 days. She noticed a discharge shortly after her menstrual cycle. She denies any foul-smell. Denies any concerns of sexually transmitted diseases.  Review of Systems  Constitutional: Negative.   Respiratory: Negative.   Cardiovascular: Negative.   Genitourinary: Positive for vaginal discharge. Negative for urgency, hematuria, vaginal pain and pelvic pain.  Musculoskeletal: Negative.   Psychiatric/Behavioral: Negative.    Past Medical History  Diagnosis Date  . MHA (microangiopathic hemolytic anemia)   . DUB (dysfunctional uterine bleeding)   . Miscarriage Sept 2008  . Depression   . Palpitations     History   Social History  . Marital Status: Single    Spouse Name: N/A    Number of Children: N/A  . Years of Education: N/A   Occupational History  . Not on file.   Social History Main Topics  . Smoking status: Former Games developer  . Smokeless tobacco: Not on file  . Alcohol Use:   . Drug Use:   . Sexually Active:    Other Topics Concern  . Not on file   Social History Narrative  . No narrative on file    No past surgical history on file.  Family History  Problem Relation Age of Onset  . Hypertension Mother   . Alzheimer's disease Father     Allergies  Allergen Reactions  . Codeine Phosphate     REACTION: side effects  . Metronidazole     REACTION: nausea,vomiting,diarrhea    Current Outpatient Prescriptions on File Prior to Visit  Medication Sig Dispense Refill  . aluminum chloride (DRYSOL) 20 % external solution Apply topically daily as needed.  60 mL  3  . ethynodiol-ethinyl estradiol (ZOVIA 1/50E, 28,) 1-50 MG-MCG per tablet Take 1 tablet by mouth daily.  90 tablet  3  . multivitamin (THERAGRAN) per tablet Take 1 tablet by  mouth daily.        . nadolol (CORGARD) 20 MG tablet One p.o. B.i.d.  200 tablet  3    BP 108/60  Temp(Src) 98.6 F (37 C) (Oral)  Wt 116 lb (52.617 kg)chart  Objective:   Physical Exam  Constitutional: She appears well-developed and well-nourished.  Pulmonary/Chest: Effort normal.  Genitourinary: Vaginal discharge found.       Positive whiff test. Frothy white discharge.  Neurological: She is alert.  Skin: Skin is warm and dry.  Psychiatric: She has a normal mood and affect.          Assessment & Plan:  Assessment: Vaginal discharge, bacterial vaginosis   Plan: Clindamycin 300 mg twice a day x7 days. Instructions given on bacterial vaginosis. Patient probably office with any questions or concerns. Recheck with Dr. Tawanna Cooler as scheduled or sooner when necessary. He is

## 2011-07-04 LAB — WET PREP BY MOLECULAR PROBE
Gardnerella vaginalis: POSITIVE — AB
Trichomonas vaginosis: NEGATIVE

## 2011-08-15 ENCOUNTER — Other Ambulatory Visit: Payer: Self-pay | Admitting: Family Medicine

## 2011-08-15 DIAGNOSIS — Z1231 Encounter for screening mammogram for malignant neoplasm of breast: Secondary | ICD-10-CM

## 2011-08-20 ENCOUNTER — Other Ambulatory Visit: Payer: Self-pay | Admitting: Family Medicine

## 2011-09-08 ENCOUNTER — Ambulatory Visit
Admission: RE | Admit: 2011-09-08 | Discharge: 2011-09-08 | Disposition: A | Discharge status: Home / Self Care | Payer: 59 | Source: Ambulatory Visit | Attending: Family Medicine | Admitting: Family Medicine

## 2011-09-08 DIAGNOSIS — Z1231 Encounter for screening mammogram for malignant neoplasm of breast: Secondary | ICD-10-CM

## 2011-09-21 ENCOUNTER — Other Ambulatory Visit: Payer: Self-pay | Admitting: Family Medicine

## 2011-10-04 ENCOUNTER — Encounter: Payer: Self-pay | Admitting: Family Medicine

## 2011-10-04 ENCOUNTER — Ambulatory Visit (INDEPENDENT_AMBULATORY_CARE_PROVIDER_SITE_OTHER): Payer: 59 | Admitting: Family Medicine

## 2011-10-04 VITALS — BP 110/76 | Temp 98.5°F | Wt 113.0 lb

## 2011-10-04 DIAGNOSIS — L0591 Pilonidal cyst without abscess: Secondary | ICD-10-CM

## 2011-10-04 DIAGNOSIS — K645 Perianal venous thrombosis: Secondary | ICD-10-CM | POA: Insufficient documentation

## 2011-10-04 MED ORDER — HYDROCORTISONE 2.5 % RE CREA: TOPICAL_CREAM | Freq: Two times a day (BID) | RECTAL | Status: AC

## 2011-10-04 NOTE — Patient Instructions (Signed)
Warm soaks twice daily and apply the Anusol cream until the hemorrhoid heals  Daily stool softeners  Return in August for your annual exam sooner if any problems  The other lesion is a pilonidal cyst

## 2011-10-04 NOTE — Progress Notes (Signed)
  Subjective:    Patient ID: Cynthia Delgado, female    DOB: 1965/07/03, 46 y.o.   MRN: [id number]  HPI Cynthia Delgado is a 46 year old single female who comes in today for evaluation of a lump around her rectum  She only has a bowel movement about every 2-3 days and has to take a stool softener to do that. This is been gone on for years. Recently she noticed a lump and some irritation around her rectum.  About 6 weeks ago she had tenderness at the tip [place] bone noticed a lump in and drain spontaneously. This happened once prior when she was about 46 years of age.   Review of Systems General and GI review of systems otherwise negative will well-developed well-nourished female in no acute distress examination rectum shows a 10 mm x 10 mm thrombosed hemorrhoid    Objective:   Physical Exam Well-developed well nourished female no acute distress examination rectum shows a 10 mm x 10 mm thrombosed hemorrhoid also a dimple at the end of her tailbone consistent with a pallor vital cyst       Assessment & Plan:  Thrombosed hemorrhoid plan stool softeners Anusol-HC return when necessary  Pilonidal  cyst ,,,,,,,,,,,,,,asymptomatic now observe

## 2011-11-13 ENCOUNTER — Other Ambulatory Visit: Payer: Self-pay | Admitting: Family Medicine

## 2011-11-21 ENCOUNTER — Other Ambulatory Visit (INDEPENDENT_AMBULATORY_CARE_PROVIDER_SITE_OTHER): Payer: 59

## 2011-11-21 DIAGNOSIS — Z0000 Encounter for general adult medical examination without abnormal findings: Secondary | ICD-10-CM

## 2011-11-21 LAB — CBC WITH DIFFERENTIAL/PLATELET
Basophils Relative: 0.8 % (ref 0.0–3.0)
Eosinophils Relative: 4.6 % (ref 0.0–5.0)
HCT: 36.5 % (ref 36.0–46.0)
MCV: 99.3 fl (ref 78.0–100.0)
Monocytes Absolute: 0.6 10*3/uL (ref 0.1–1.0)
Monocytes Relative: 7.6 % (ref 3.0–12.0)
Neutrophils Relative %: 64 % (ref 43.0–77.0)
RBC: 3.68 Mil/uL — ABNORMAL LOW (ref 3.87–5.11)
WBC: 8.1 10*3/uL (ref 4.5–10.5)

## 2011-11-21 LAB — HEPATIC FUNCTION PANEL
ALT: 16 U/L (ref 0–35)
AST: 19 U/L (ref 0–37)
Bilirubin, Direct: 0 mg/dL (ref 0.0–0.3)
Total Protein: 6.2 g/dL (ref 6.0–8.3)

## 2011-11-21 LAB — POCT URINALYSIS DIPSTICK
Bilirubin, UA: NEGATIVE
Blood, UA: NEGATIVE
Glucose, UA: NEGATIVE
Leukocytes, UA: NEGATIVE
Nitrite, UA: NEGATIVE

## 2011-11-21 LAB — TSH: TSH: 1.46 u[IU]/mL (ref 0.35–5.50)

## 2011-11-21 LAB — BASIC METABOLIC PANEL
Chloride: 102 mEq/L (ref 96–112)
Creatinine, Ser: 0.6 mg/dL (ref 0.4–1.2)
Potassium: 4.2 mEq/L (ref 3.5–5.1)

## 2011-11-21 LAB — LIPID PANEL
Total CHOL/HDL Ratio: 2
Triglycerides: 92 mg/dL (ref 0.0–149.0)

## 2011-12-04 ENCOUNTER — Encounter: Payer: 59 | Admitting: Family Medicine

## 2011-12-05 ENCOUNTER — Other Ambulatory Visit (HOSPITAL_COMMUNITY)
Admission: RE | Admit: 2011-12-05 | Discharge: 2011-12-05 | Disposition: A | Discharge status: Home / Self Care | Payer: 59 | Source: Ambulatory Visit | Attending: Family Medicine | Admitting: Family Medicine

## 2011-12-05 ENCOUNTER — Encounter: Payer: Self-pay | Admitting: Family Medicine

## 2011-12-05 ENCOUNTER — Ambulatory Visit (INDEPENDENT_AMBULATORY_CARE_PROVIDER_SITE_OTHER): Payer: 59 | Admitting: Family Medicine

## 2011-12-05 VITALS — BP 102/64 | Temp 98.3°F | Ht 59.25 in | Wt 112.0 lb

## 2011-12-05 DIAGNOSIS — Z01419 Encounter for gynecological examination (general) (routine) without abnormal findings: Secondary | ICD-10-CM

## 2011-12-05 DIAGNOSIS — N949 Unspecified condition associated with female genital organs and menstrual cycle: Secondary | ICD-10-CM

## 2011-12-05 DIAGNOSIS — N6019 Diffuse cystic mastopathy of unspecified breast: Secondary | ICD-10-CM

## 2011-12-05 DIAGNOSIS — I499 Cardiac arrhythmia, unspecified: Secondary | ICD-10-CM

## 2011-12-05 DIAGNOSIS — Z0000 Encounter for general adult medical examination without abnormal findings: Secondary | ICD-10-CM

## 2011-12-05 MED ORDER — NADOLOL 20 MG PO TABS: 20.0000 mg | ORAL_TABLET | Freq: Two times a day (BID) | ORAL | Status: DC | PRN

## 2011-12-05 MED ORDER — ETHYNODIOL DIAC-ETH ESTRADIOL 1-50 MG-MCG PO TABS: 1.0000 | ORAL_TABLET | Freq: Every day | ORAL | Status: DC

## 2011-12-05 NOTE — Progress Notes (Signed)
  Subjective:    Patient ID: Cynthia Delgado, female    DOB: 06/23/1965, 46 y.o.   MRN: [id number]  HPI Cynthia Delgado is a 46 year old single female nonsmoker soon to be married in September,,,,,, honeymoon in [place],, she has a history of dysfunction uterine bleeding for which she takes Corgard 20 mg twice a day  She has a history of dysfunction uterine bleeding for which she takes BCPs    She's had a history recently of migraine headaches which were cluster last about a week and went away. She's also had 3 episodes of vertigo but only lasts a few seconds and went away. No hearing loss etc.    Review of Systems  Constitutional: Negative.   HENT: Negative.   Eyes: Negative.   Respiratory: Negative.   Cardiovascular: Negative.   Gastrointestinal: Negative.   Genitourinary: Negative.   Musculoskeletal: Negative.   Neurological: Negative.   Hematological: Negative.   Psychiatric/Behavioral: Negative.        Objective:   Physical Exam  Constitutional: She appears well-developed and well-nourished.  HENT:  Head: Normocephalic and atraumatic.  Right Ear: External ear normal.  Left Ear: External ear normal.  Nose: Nose normal.  Mouth/Throat: Oropharynx is clear and moist.  Eyes: EOM are normal. Pupils are equal, round, and reactive to light.  Neck: Normal range of motion. Neck supple. No thyromegaly present.  Cardiovascular: Normal rate, regular rhythm, normal heart sounds and intact distal pulses.  Exam reveals no gallop and no friction rub.   No murmur heard. Pulmonary/Chest: Effort normal and breath sounds normal.  Abdominal: Soft. Bowel sounds are normal. She exhibits no distension and no mass. There is no tenderness. There is no rebound.  Genitourinary: Vagina normal and uterus normal. Guaiac negative stool. No vaginal discharge found.       Bilateral breast exam shows multiple small fibrocystic changes recent mammogram normal  Musculoskeletal: Normal range of motion.    Lymphadenopathy:    She has no cervical adenopathy.  Neurological: She is alert. She has normal reflexes. No cranial nerve deficit. She exhibits normal muscle tone. Coordination normal.  Skin: Skin is warm and dry.  Psychiatric: She has a normal mood and affect. Her behavior is normal. Judgment and thought content normal.          Assessment & Plan:   healthy female  Dysfunction uterine bleeding continue BCPs  Palpitations continue Corgard 20 mg twice a day  Cluster migraine resolve spontaneously  Vertigo resolved spontaneously

## 2011-12-05 NOTE — Patient Instructions (Signed)
Continue your current medications  Followup in 1 year sooner if any problems 

## 2012-01-22 ENCOUNTER — Telehealth: Payer: Self-pay | Admitting: Family Medicine

## 2012-01-22 DIAGNOSIS — I82409 Acute embolism and thrombosis of unspecified deep veins of unspecified lower extremity: Secondary | ICD-10-CM

## 2012-01-22 NOTE — Telephone Encounter (Signed)
Please schedule

## 2012-01-22 NOTE — Telephone Encounter (Signed)
[facility] called and said that pt was in for DVT. Pt will be started on coumadin with a lovenox bridge. Pt was admitted on 01/21/12 and will be discharged 01/22/12. The discharging doctor is wanting pt to come in for PT/INR on Thurs. Pls order labs.

## 2012-01-22 NOTE — Telephone Encounter (Signed)
Left message to notify pt to call office to schedule coumadin clinic appt

## 2012-01-24 ENCOUNTER — Telehealth: Payer: Self-pay | Admitting: Family Medicine

## 2012-01-24 NOTE — Telephone Encounter (Signed)
Caller: Bethani/Patient; Patient Name: Cynthia Delgado; PCP: Kelle Darting [facility]); Best Callback Phone Number: [phone]; Reason for call: Other Ws released from hospital 10-1 after being diagnosed with DVT. Is taking Lovenox and Coumadin. Has "tingling"  on right side of face 10-2. States is around eye and down side of face. Is swalowing/speaking well. No "droopiness" of face. Has appointment with MD 10-3 for follow up. Call back parameters given per Numbness/tingling protocol.

## 2012-01-25 ENCOUNTER — Encounter: Payer: Self-pay | Admitting: Family Medicine

## 2012-01-25 ENCOUNTER — Ambulatory Visit (INDEPENDENT_AMBULATORY_CARE_PROVIDER_SITE_OTHER): Payer: 59 | Admitting: Family

## 2012-01-25 ENCOUNTER — Ambulatory Visit (INDEPENDENT_AMBULATORY_CARE_PROVIDER_SITE_OTHER): Payer: 59 | Admitting: Family Medicine

## 2012-01-25 VITALS — BP 124/80 | Temp 98.4°F | Wt 110.0 lb

## 2012-01-25 DIAGNOSIS — I82409 Acute embolism and thrombosis of unspecified deep veins of unspecified lower extremity: Secondary | ICD-10-CM

## 2012-01-25 DIAGNOSIS — I2699 Other pulmonary embolism without acute cor pulmonale: Secondary | ICD-10-CM

## 2012-01-25 LAB — POCT INR: INR: 1.1

## 2012-01-25 NOTE — Patient Instructions (Signed)
Take your Coumadin as directed by [name]  Followup next week continue the Lovenox 1 twice daily

## 2012-01-25 NOTE — Progress Notes (Signed)
  Subjective:    Patient ID: Cynthia Delgado, female    DOB: 11-15-1965, 46 y.o.   MRN: [id number]  HPI Cynthia Delgado is a 46 year old recently married female who comes in today for followup having had a DVT with a secondary pulmonary embolus  She states that 9 days ago she flew back from [place]. She took an aspirin tablet before the airplane flight although she did sit a lot. The following Friday she noticed some tenderness in her left calf. Over the next 24 hours ago it worse and she went to the emergency room at [facility] on Sunday. Doppler showed a DVT and she was admitted to the hospital for IV heparin. They were getting ready to discharge her in she complained of some chest pain and her left chest. A CT scan showed a pulmonary embolus. She was discharged on Lovenox and Coumadin and comes in today for followup. She had no hemoptysis. She states she feels much better. She stopped taking her birth control pills while in the hospital.  Family history negative for vascular disease.  INR today 1.1   Review of Systems General and pulmonary review of systems otherwise negative    Objective:   Physical Exam Well-developed well-nourished female no acute distress examination of lower extremities shows some slight tenderness just superior to the ankle posterior calf. Lungs are clear to auscultation cardiac exam normal       Assessment & Plan:  DVT with secondary small pulmonary embolus plan per instructions of [name] she's to increase her Coumadin take 10 mg today and Friday 5 mg starting Saturday followup pro time next week  And today started to again no more birth control pills and/or hormones. Uses condoms for birth control for now we'll discuss other options later

## 2012-01-25 NOTE — Patient Instructions (Addendum)
Continue Lovenox twice daily. Take 10mg  of Coumadin today and tomorrow. Then Coumadin 5mg  a day. Recheck INR on Monday.    Latest dosing instructions   Total Sun Mon Tue Wed Thu Fri Sat   35 5 mg 5 mg 5 mg 5 mg 5 mg 5 mg 5 mg    (5 mg1) (5 mg1) (5 mg1) (5 mg1) (5 mg1) (5 mg1) (5 mg1)

## 2012-01-29 ENCOUNTER — Ambulatory Visit (INDEPENDENT_AMBULATORY_CARE_PROVIDER_SITE_OTHER): Payer: 59 | Admitting: Family

## 2012-01-29 DIAGNOSIS — I82409 Acute embolism and thrombosis of unspecified deep veins of unspecified lower extremity: Secondary | ICD-10-CM

## 2012-01-29 LAB — POCT INR: INR: 2.1

## 2012-01-29 NOTE — Patient Instructions (Addendum)
Coumadin 5mg  a day except Wednesdays 7.5mg  (1 1/2 tab).    Latest dosing instructions   Total Sun Mon Tue Wed Thu Fri Sat   37.5 5 mg 5 mg 5 mg 7.5 mg 5 mg 5 mg 5 mg    (5 mg1) (5 mg1) (5 mg1) (5 mg1.5) (5 mg1) (5 mg1) (5 mg1)

## 2012-01-30 ENCOUNTER — Encounter: Payer: 59 | Admitting: Family

## 2012-01-30 ENCOUNTER — Telehealth: Payer: Self-pay | Admitting: Family Medicine

## 2012-01-30 NOTE — Telephone Encounter (Signed)
Please advise 

## 2012-01-30 NOTE — Telephone Encounter (Signed)
Caller: [name]/Patient; Phone: [phone]; Reason for Call: Caller: [name]/Patient; Patient Name: Cynthia Delgado; PCP: Kelle Darting [facility]); Best Callback Phone Number: [phone]  LMP 01/23/12.  Patient states she was hospitalized for Deep Vein Thrombosis of right leg and Pulmonary Embolism 01/21/12-01/23/12.  Patient states she developed tingling in left leg from knee down 01/30/12.  States symptoms are intermittent.  States she has had similar symptoms in the past related to inflammation of her sciatic nerve from back problems.  Denies pain or swelling in leg.  No redness noted.  Denies injury.  Afebrile.  Patient states she had brief episode of tingling of left side of face 01/30/12 1200.  States symptoms lasted approximately 30 minutes and resolved.  Denies difficulty swallowing.  Denies facial weakness or drooping.  Speech intact.  Denies visual disturbances.   Triage per Numbness or Tingling Protocol.  Emergent disposition obtained related to triage assessment for " Has recent episode of numbness/tingling of leg or face and now resolved.  " Patient declines to be evaluated in the Emergency Department.  Patient requests to be seen in office.  No appointments available in Epic Electronic Health Record.  Pateint advised to call 911 if symptoms recur ir increase.  Advised not to drive self.  Patient verbalizes understanding.   EMERGENT DISPOSITION OBTAINED.  NO APPOINTMENTS AVAILABLE.  PATIENT REQUESTS OFFICE VISIT.  PLEASE RETURN CALL TO PATIENT REGARDING POSSIBLE WORK IN APPOINTMENT OR IF EMERGENCY DEPARTMENT IS ADVISED, PER DR.  TODD.  PATIENT CAN BE REACHED AT [phone].   Above message sent to triage pool, with high priority, via Epic Electronic Health Record.

## 2012-01-30 NOTE — Telephone Encounter (Signed)
I called Cynthia Delgado......... she has had symptoms for a long time of cervical disc and lumbar disease with occasional tingling in her legs and sometimes her arm. Symptoms come and go reassured it's not related to the recent DVT and clots in her lungs office visit when necessary

## 2012-01-31 NOTE — Telephone Encounter (Signed)
Dr todd spoke with pt

## 2012-02-05 ENCOUNTER — Telehealth: Payer: Self-pay | Admitting: Family Medicine

## 2012-02-05 NOTE — Telephone Encounter (Signed)
Please call Celine happy to work her in tomorrow Wednesday or Thursday

## 2012-02-05 NOTE — Telephone Encounter (Signed)
Patient states that she is feeling better.  She will call back if any symptoms come back.

## 2012-02-05 NOTE — Telephone Encounter (Signed)
Please advise 

## 2012-02-05 NOTE — Telephone Encounter (Signed)
Patient called stating that she had recently stated taking coumadin and she is starting to have anxiety and sob. Please advise.

## 2012-02-05 NOTE — Telephone Encounter (Signed)
Please call patient and schedule appointment if necessary.

## 2012-02-09 ENCOUNTER — Ambulatory Visit (INDEPENDENT_AMBULATORY_CARE_PROVIDER_SITE_OTHER): Payer: 59 | Admitting: Family

## 2012-02-09 ENCOUNTER — Encounter: Payer: Self-pay | Admitting: Internal Medicine

## 2012-02-09 ENCOUNTER — Ambulatory Visit (INDEPENDENT_AMBULATORY_CARE_PROVIDER_SITE_OTHER): Payer: 59 | Admitting: Internal Medicine

## 2012-02-09 VITALS — BP 102/82 | Temp 97.9°F | Wt 113.0 lb

## 2012-02-09 DIAGNOSIS — I82409 Acute embolism and thrombosis of unspecified deep veins of unspecified lower extremity: Secondary | ICD-10-CM

## 2012-02-09 DIAGNOSIS — I2699 Other pulmonary embolism without acute cor pulmonale: Secondary | ICD-10-CM

## 2012-02-09 NOTE — Patient Instructions (Signed)
Keep right leg elevated as much as possible  Call or return to clinic prn if these symptoms worsen or fail to improve as anticipated.

## 2012-02-09 NOTE — Progress Notes (Signed)
Subjective:    Patient ID: Cynthia Delgado, female    DOB: [birth year], 46 y.o.   MRN: [id number]  HPI  46 year old patient who was hospitalized approximately 3 weeks ago for treatment of a right leg DVT. She presently is on Coumadin anticoagulation. Last INR 2.1. She is scheduled for followup INR in 3 days. Her present dose is Coumadin 5 mg 6 days per week and 7.5 mg every Wednesday. She presents today complaining of some discomfort and aching involving the right leg. There's been no swelling redness or excessive warmth that prompted her ED visit 3 weeks ago. No pulmonary complaints  Past Medical History  Diagnosis Date  . MHA (microangiopathic hemolytic anemia)   . DUB (dysfunctional uterine bleeding)   . Miscarriage Sept 2008  . Depression   . Palpitations     History   Social History  . Marital Status: Single    Spouse Name: N/A    Number of Children: N/A  . Years of Education: N/A   Occupational History  . Not on file.   Social History Main Topics  . Smoking status: Former Games developer  . Smokeless tobacco: Not on file  . Alcohol Use:   . Drug Use:   . Sexually Active:    Other Topics Concern  . Not on file   Social History Narrative  . No narrative on file    No past surgical history on file.  Family History  Problem Relation Age of Onset  . Hypertension Mother   . Alzheimer's disease Father     Allergies  Allergen Reactions  . Codeine Phosphate     REACTION: side effects  . Metronidazole     REACTION: nausea,vomiting,diarrhea    Current Outpatient Prescriptions on File Prior to Visit  Medication Sig Dispense Refill  . HYPERCARE 20 % external solution APPLY TOPICALLY DAILY AS NEEDED  60 mL  2  . multivitamin (THERAGRAN) per tablet Take 1 tablet by mouth daily.        . nadolol (CORGARD) 20 MG tablet Take 1 tablet (20 mg total) by mouth 2 (two) times daily as needed. One p.o. B.i.d.  200 tablet  3    BP 102/82  Temp 97.9 F (36.6 C) (Oral)  Wt 113 lb  (51.256 kg)       Review of Systems  Constitutional: Negative.   HENT: Negative for hearing loss, congestion, sore throat, rhinorrhea, dental problem, sinus pressure and tinnitus.   Eyes: Negative for pain, discharge and visual disturbance.  Respiratory: Negative for cough and shortness of breath.   Cardiovascular: Negative for chest pain, palpitations and leg swelling.  Gastrointestinal: Negative for nausea, vomiting, abdominal pain, diarrhea, constipation, blood in stool and abdominal distention.  Genitourinary: Negative for dysuria, urgency, frequency, hematuria, flank pain, vaginal bleeding, vaginal discharge, difficulty urinating, vaginal pain and pelvic pain.  Musculoskeletal: Negative for joint swelling, arthralgias (Mild right calf pain) and gait problem.  Skin: Negative for rash.  Neurological: Negative for dizziness, syncope, speech difficulty, weakness, numbness and headaches.  Hematological: Negative for adenopathy.  Psychiatric/Behavioral: Negative for behavioral problems, dysphoric mood and agitation. The patient is not nervous/anxious.        Objective:   Physical Exam  Constitutional: She appears well-developed and well-nourished. No distress.  Pulmonary/Chest: Effort normal and breath sounds normal.       O2 saturation 99%  Musculoskeletal: She exhibits no edema and no tenderness.       Right lower leg appear to be normal no tenderness  swelling or erythema          Assessment & Plan:   Status post right leg DVT complicated by small pulmonary embolism. We'll check an INR today and adjust Coumadin as necessary. Recheck 2 weeks Patient will consider a support/TED hose

## 2012-02-09 NOTE — Patient Instructions (Addendum)
Today only take 1/2 tab. Eat and extra serving of green veggies. Coumadin 5mg  a day except Wednesdays 7.5mg  (1 1/2 tab). Recheck in 3 weeks.    Latest dosing instructions   Total Sun Mon Tue Wed Thu Fri Sat   37.5 5 mg 5 mg 5 mg 7.5 mg 5 mg 5 mg 5 mg    (5 mg1) (5 mg1) (5 mg1) (5 mg1.5) (5 mg1) (5 mg1) (5 mg1)

## 2012-02-12 ENCOUNTER — Encounter: Payer: 59 | Admitting: Family

## 2012-02-19 ENCOUNTER — Telehealth: Payer: Self-pay | Admitting: Family Medicine

## 2012-02-19 NOTE — Telephone Encounter (Signed)
[facility] Triage Call Report Triage Record Num: [id number] Operator: Hillary Bow Patient Name: Cynthia Delgado Call Date & Time: 02/18/2012 8:30:08AM Patient Phone: [phone] PCP: Eugenio Hoes. Todd Patient Gender: Female PCP Fax : [phone] Patient DOB: 05/04/1965 [facility] Name: [facility] Reason for Call: ER CALL. Caller: [name]/Patient; PCP: Kelle Darting (Family [facility]); CB#: [phone]; Call regarding Right Leg Calf pain, onset 10-24. Pt has history of DVT in Right leg. All emergent symptoms ruled out per Leg non-injury protocol, see in 24 hrs due to mild pain that has not improved w/ 24 hrs home care. Discussed symptoms to watch out for, i.e. swelling, pain w/ movement, warmness to leg or unbearable pain. Pt will follow up w/ office on 10-28 for an appt. Protocol(s) Used: Leg Non-Injury Recommended Outcome per Protocol: See Provider within 24 hours Reason for Outcome: New onset mild to moderate pain that has not improved with 24 hours of home care Care Advice: ~ 10/

## 2012-02-26 ENCOUNTER — Telehealth: Payer: Self-pay | Admitting: Family Medicine

## 2012-02-26 NOTE — Telephone Encounter (Signed)
Pt is on Coumadin. This is first menstrual cycle since being on it, and it is extraordinarily heavy. Please advise and call pt. Thanks.

## 2012-02-28 ENCOUNTER — Telehealth: Payer: Self-pay | Admitting: Family Medicine

## 2012-02-28 NOTE — Telephone Encounter (Signed)
Fleet Contras, can she be worked in today? (Thursday)

## 2012-02-28 NOTE — Telephone Encounter (Signed)
Caller: Cynthia Delgado/Patient; Patient Name: Cynthia Delgado; PCP: Kelle Darting [facility]); Best Callback Phone Number: [phone]; Reason for call: Hypertension; onset a month ago; BP 124/88; last OV 8/13; c/o slight lightheadedness, but that is not unusual; All emergent sxs of Hypertension, Diagnosed or Suspected protocol r/o; home care advice given; informed Cynthia Delgado that HTN is diagnosed with 2 or more readings at separate OV with sBP > 140 or dBP > 90; says her BP have also been much lower and would like an appt to discuss it with Dr.Todd; checked epic and no available appts seen for this week; could office call Cynthia Delgado back with an available time slot???

## 2012-02-29 ENCOUNTER — Ambulatory Visit (INDEPENDENT_AMBULATORY_CARE_PROVIDER_SITE_OTHER): Payer: 59 | Admitting: Family

## 2012-02-29 ENCOUNTER — Encounter: Payer: Self-pay | Admitting: Family Medicine

## 2012-02-29 ENCOUNTER — Ambulatory Visit (INDEPENDENT_AMBULATORY_CARE_PROVIDER_SITE_OTHER): Payer: 59 | Admitting: Family Medicine

## 2012-02-29 VITALS — BP 130/90 | Temp 98.0°F | Wt 110.0 lb

## 2012-02-29 DIAGNOSIS — I82409 Acute embolism and thrombosis of unspecified deep veins of unspecified lower extremity: Secondary | ICD-10-CM

## 2012-02-29 DIAGNOSIS — I499 Cardiac arrhythmia, unspecified: Secondary | ICD-10-CM

## 2012-02-29 NOTE — Patient Instructions (Signed)
Purchaser omron digital blood pressure pump up device.  Check your BP daily in the morning  The parameters are 135/85 or less  Return when necessary  Continue the Corgard 1 daily

## 2012-02-29 NOTE — Telephone Encounter (Signed)
Okay after 2 pm

## 2012-02-29 NOTE — Progress Notes (Signed)
  Subjective:    Patient ID: Vista Deck, female    DOB: 1965/05/13, 46 y.o.   MRN: [id number]  HPI [name] is a delightful 46 year old recently married female nonsmoker who comes in today to discuss high blood pressure  She states that her mother had hypertension and had a stroke and dropped dead in front of her and therefore she is concerned about her blood pressure. Her blood pressure by Fleet Contras today was 130/90 by me 120/80 after she sat down and rested for about 10 minutes. She takes Corgard 20 mg daily because of palpitations. She also takes an aspirin tablet along with her Coumadin because of the DVT. She's followed carefully in the Coumadin clinic   Review of Systems    general and cardiovascular review of systems negative Objective:   Physical Exam  Well-developed well-nourished female in acute distress BP right arm sitting position 120/80 pulse 80 and regular cardiac exam normal      Assessment & Plan:  Normal blood pressure  History of palpitations continue Corgard  Family history of hypertension and stroke would recommend continuing good health habits daily aspirin monitor BP at home

## 2012-02-29 NOTE — Patient Instructions (Addendum)
Take and extra half tablet today only. Then continue Coumadin 5mg  a day except Wednesdays 7.5mg  (1 1/2 tab). Recheck in 4 weeks.    Latest dosing instructions   Total Sun Mon Tue Wed Thu Fri Sat   37.5 5 mg 5 mg 5 mg 7.5 mg 5 mg 5 mg 5 mg    (5 mg1) (5 mg1) (5 mg1) (5 mg1.5) (5 mg1) (5 mg1) (5 mg1)

## 2012-03-01 ENCOUNTER — Telehealth: Payer: Self-pay | Admitting: Family Medicine

## 2012-03-01 ENCOUNTER — Encounter: Payer: 59 | Admitting: Family

## 2012-03-01 NOTE — Telephone Encounter (Signed)
Per Dr Tawanna Cooler - check BP x 3 daily and bring data and devise to office on Thursday.  Patient is aware and an appointment made.

## 2012-03-01 NOTE — Telephone Encounter (Signed)
Pt called and said that bp was high today. The highest was 140/104 and the lowest was 122/95. Pt is req call back from [place].

## 2012-03-02 ENCOUNTER — Encounter: Payer: Self-pay | Admitting: Family Medicine

## 2012-03-02 ENCOUNTER — Ambulatory Visit (INDEPENDENT_AMBULATORY_CARE_PROVIDER_SITE_OTHER): Payer: 59 | Admitting: Family Medicine

## 2012-03-02 VITALS — BP 110/78 | HR 71 | Temp 97.2°F | Wt 109.0 lb

## 2012-03-02 DIAGNOSIS — F329 Major depressive disorder, single episode, unspecified: Secondary | ICD-10-CM

## 2012-03-02 DIAGNOSIS — F419 Anxiety disorder, unspecified: Secondary | ICD-10-CM

## 2012-03-02 DIAGNOSIS — F341 Dysthymic disorder: Secondary | ICD-10-CM

## 2012-03-02 MED ORDER — CLONAZEPAM 0.5 MG PO TABS: 0.5000 mg | ORAL_TABLET | Freq: Three times a day (TID) | ORAL | Status: DC | PRN

## 2012-03-02 NOTE — Progress Notes (Signed)
  Subjective:    Patient ID: Cynthia Delgado, female    DOB: 1965-10-12, 46 y.o.   MRN: [id number]  HPI HTN- pt reports typical BP is 90-110/60-80.  Notes that BP started to elevate after hospitalization for DVT/PE.  Since then, BP has been elevated 120-140s SBP.  Saw Dr Tawanna Cooler 2 days ago.  Had BP taken yesterday at work and BP was 142/109.  Called PCP and was told to monitor BP and return next week.  Yesterday developed HA, ringing in her ears, and 'extreme anxiety feeling'.  Pt is not sure if anxiety is causing elevated BP or BP is causing anxiety.  On Nadolol 20mg  daily.  Has taken 40 mg previously for irregular HR, not BP.  Has hx of anxiety and depression- on Ativan after miscarriage in 2006-09-26.  Mom died of stroke at age 32 and pt is now 81- very anxious about this.   Review of Systems For ROS see HPI     Objective:   Physical Exam  Constitutional: She is oriented to person, place, and time. She appears well-developed and well-nourished. No distress.  HENT:  Head: Normocephalic and atraumatic.  Eyes: Conjunctivae normal and EOM are normal. Pupils are equal, round, and reactive to light.  Neck: Normal range of motion. Neck supple. No thyromegaly present.  Cardiovascular: Normal rate, regular rhythm, normal heart sounds and intact distal pulses.   No murmur heard. Pulmonary/Chest: Effort normal and breath sounds normal. No respiratory distress.  Abdominal: Soft. She exhibits no distension. There is no tenderness.  Musculoskeletal: She exhibits no edema.  Lymphadenopathy:    She has no cervical adenopathy.  Neurological: She is alert and oriented to person, place, and time.  Skin: Skin is warm and dry.  Psychiatric: Her behavior is normal.       Anxious          Assessment & Plan:

## 2012-03-02 NOTE — Patient Instructions (Addendum)
Follow up w/ Dr Tawanna Cooler as scheduled I think a lot of the blood pressure elevation and increased heart rate is due to your anxiety Start the klonopin as needed for anxiety- up to 3x/day- and see if your symptoms improve Call with any questions or concerns Hang in there!

## 2012-03-02 NOTE — Assessment & Plan Note (Signed)
New to provider.  Suspect that pt's intermittent elevations in BP and HR are more related to anxiety than true labile HTN.  HA consistent w/ tension HA.  Start low dose Klonopin for anxiety and see if sxs improve.  Pt has f/u w/ PCP scheduled for this week.  Provided reassurance and red flags that should prompt return.  Pt expressed understanding and is in agreement w/ plan.

## 2012-03-04 ENCOUNTER — Telehealth: Payer: Self-pay | Admitting: Family Medicine

## 2012-03-04 NOTE — Telephone Encounter (Signed)
Pt calling to make you aware of a medication change. She has been rx'd prescribed Klonopin 0.5mg  up to TID as needed for anxiety. Rx'd to her by Dr. Beverely Low on Saturday. Pt wanting to know if this will have any effect on pt/inr. Please advise. Thank you.

## 2012-03-04 NOTE — Telephone Encounter (Signed)
It is fine, no interaction.

## 2012-03-04 NOTE — Telephone Encounter (Signed)
Pt aware of Padonda's note

## 2012-03-07 ENCOUNTER — Ambulatory Visit (INDEPENDENT_AMBULATORY_CARE_PROVIDER_SITE_OTHER): Payer: 59 | Admitting: Family Medicine

## 2012-03-07 ENCOUNTER — Encounter: Payer: Self-pay | Admitting: Family Medicine

## 2012-03-07 VITALS — BP 112/72

## 2012-03-07 DIAGNOSIS — F410 Panic disorder [episodic paroxysmal anxiety] without agoraphobia: Secondary | ICD-10-CM

## 2012-03-07 DIAGNOSIS — I2699 Other pulmonary embolism without acute cor pulmonale: Secondary | ICD-10-CM

## 2012-03-07 MED ORDER — CITALOPRAM HYDROBROMIDE 20 MG PO TABS: 20.0000 mg | ORAL_TABLET | Freq: Every day | ORAL | Status: DC

## 2012-03-07 MED ORDER — WARFARIN SODIUM 5 MG PO TABS: 5.0000 mg | ORAL_TABLET | Freq: Every day | ORAL | Status: DC

## 2012-03-07 NOTE — Patient Instructions (Signed)
Begin the Celexa 20 mg daily at bedtime  Klonopin,,,,,,,,,,, one half tab when necessary  Return in 4 weeks for followup sooner if any problems

## 2012-03-07 NOTE — Progress Notes (Signed)
  Subjective:    Patient ID: Vista Deck, female    DOB: 07-06-1965, 46 y.o.   MRN: [id number]  HPI [name] is a 46 year old recently married female who comes in today for followup of anxiety attacks   historically she's had some with exercise and meditation. Recently she had a DVT with a PE and since that time the panic attacks have gotten more severe.  Over the weekend she went to the Saturday clinic exam was normal she was given Klonopin to take when necessary and she feels better.   Review of Systems  general and psychiatric review of systems otherwise negative except for history of depression which we have treated with Celexa successfully in the past    Objective:   Physical Exam   well-developed well-nourished female no acute distress       Assessment & Plan:   panic attacks probably triggered by DPE and DVT plan restart Celexa Klonopin when necessary followup in one month

## 2012-03-18 ENCOUNTER — Ambulatory Visit (INDEPENDENT_AMBULATORY_CARE_PROVIDER_SITE_OTHER): Payer: 59 | Admitting: Family

## 2012-03-18 ENCOUNTER — Telehealth: Payer: Self-pay | Admitting: Family Medicine

## 2012-03-18 DIAGNOSIS — I82409 Acute embolism and thrombosis of unspecified deep veins of unspecified lower extremity: Secondary | ICD-10-CM

## 2012-03-18 LAB — POCT INR: INR: 2

## 2012-03-18 NOTE — Telephone Encounter (Signed)
The folate compression stockings may help, but they have to be  like pantyhose  I think the home INR testing would be fine she has to look at the costs issue and what insurance will pay for  ,,,,,,,,,,,,

## 2012-03-18 NOTE — Patient Instructions (Addendum)
Continue Coumadin 5mg  a day except Wednesdays 7.5mg  (1 1/2 tab). Recheck in 2 weeks.    Latest dosing instructions   Total Sun Mon Tue Wed Thu Fri Sat   37.5 5 mg 5 mg 5 mg 7.5 mg 5 mg 5 mg 5 mg    (5 mg1) (5 mg1) (5 mg1) (5 mg1.5) (5 mg1) (5 mg1) (5 mg1)

## 2012-03-18 NOTE — Telephone Encounter (Signed)
Patient called stating that she would like a call back from the nurse regarding some sob that she experienced over the weekend and to discuss her inr. Patient declined triage. Please assist.

## 2012-03-18 NOTE — Telephone Encounter (Signed)
I spoke with patient:  1. FYI - she is having SOB, intermittently,  but is not out of breath.  She feels like she can not get a deep breath but is able to get a deep breath.  She thinks this may have to do with the anxiety she is having.  She did go for a run this morning and had no problems breathing.  2. She is still having some aching in her leg for about 7 days and would like to know if compression stockings would help?  3.  She would like to try at home INR testing and would like to know Dr Nelida Meuse opinion.

## 2012-03-22 ENCOUNTER — Encounter: Payer: Self-pay | Admitting: Family Medicine

## 2012-03-22 ENCOUNTER — Ambulatory Visit (INDEPENDENT_AMBULATORY_CARE_PROVIDER_SITE_OTHER): Payer: 59 | Admitting: Family Medicine

## 2012-03-22 DIAGNOSIS — I82409 Acute embolism and thrombosis of unspecified deep veins of unspecified lower extremity: Secondary | ICD-10-CM

## 2012-03-22 DIAGNOSIS — F410 Panic disorder [episodic paroxysmal anxiety] without agoraphobia: Secondary | ICD-10-CM

## 2012-03-22 DIAGNOSIS — I499 Cardiac arrhythmia, unspecified: Secondary | ICD-10-CM

## 2012-03-22 MED ORDER — TRAMADOL HCL 50 MG PO TABS: ORAL_TABLET | ORAL | Status: DC

## 2012-03-22 NOTE — Telephone Encounter (Signed)
Pt would like for you to call her about a symptom pertaining to the DVT. Pt. no. [phone]

## 2012-03-22 NOTE — Telephone Encounter (Signed)
Spoke with patient.

## 2012-03-22 NOTE — Patient Instructions (Signed)
Continue your current medications  Tramadol one half to one tablet at bedtime when necessary for pain

## 2012-03-22 NOTE — Progress Notes (Signed)
  Subjective:    Patient ID: Cynthia Delgado, female    DOB: 09-Jan-1966, 47 y.o.   MRN: [id number]  HPI Cynthia Delgado is a 46 year old recently married female nonsmoker who comes in today for evaluation of multiple symptoms  She is on Corgard 2 she has a history of benign arrhythmia 20 mg daily seems to be working well has an occasional skip  Her last INR was on Monday 2.0 and 5 mg of Coumadin daily. She did have a blood clot with the PE as noted above. She is warm feeling in her right eye that comes and goes. She's taken 20 mg Celexa daily along with Klonopin 0.5 when necessary for the anxiety attacks.   Review of Systems Review of systems otherwise negative    Objective:   Physical Exam  Well-developed well-nourished female no acute distress cardiac exam normal abdominal exam normal extremities normal      Assessment & Plan:  DVT reassured  Benign arrhythmia reassured panic attacks continue Klonopin when necessary and Celexa 20 mg each bedtime followup when necessary

## 2012-03-27 ENCOUNTER — Telehealth: Payer: Self-pay | Admitting: Family Medicine

## 2012-03-27 ENCOUNTER — Encounter: Payer: 59 | Admitting: Family

## 2012-03-27 NOTE — Telephone Encounter (Signed)
Pt aware and verbalized understanding.  

## 2012-03-27 NOTE — Telephone Encounter (Signed)
Pt states she can not be 100% sure she took her Warfarin on yesterday. She reports she knows Dr. Tawanna Cooler is not in office and would like message given to Adline Mango, PA who usually keeps up with her INR.  She wants to know if she should come in today to check INR or go ahead and take regular dose of Warfarin today. Please call.

## 2012-03-27 NOTE — Telephone Encounter (Signed)
Take normal dose of Coumadin today. We will recheck on Monday as scheduled. It will take about 3 days to determine if she actually missed a dose.

## 2012-04-01 ENCOUNTER — Ambulatory Visit (INDEPENDENT_AMBULATORY_CARE_PROVIDER_SITE_OTHER): Payer: 59 | Admitting: Family

## 2012-04-01 ENCOUNTER — Encounter: Payer: 59 | Admitting: Family

## 2012-04-01 ENCOUNTER — Encounter: Payer: Self-pay | Admitting: Family

## 2012-04-01 DIAGNOSIS — I82409 Acute embolism and thrombosis of unspecified deep veins of unspecified lower extremity: Secondary | ICD-10-CM

## 2012-04-01 NOTE — Patient Instructions (Signed)
Take an extra 1/2 tablet today only. Continue Coumadin 5mg  a day except Wednesdays 7.5mg  (1 1/2 tab). Recheck in  2 weeks.    Latest dosing instructions   Total Sun Mon Tue Wed Thu Fri Sat   37.5 5 mg 5 mg 5 mg 7.5 mg 5 mg 5 mg 5 mg    (5 mg1) (5 mg1) (5 mg1) (5 mg1.5) (5 mg1) (5 mg1) (5 mg1)

## 2012-04-04 ENCOUNTER — Ambulatory Visit (INDEPENDENT_AMBULATORY_CARE_PROVIDER_SITE_OTHER): Payer: 59 | Admitting: Family

## 2012-04-04 ENCOUNTER — Ambulatory Visit (INDEPENDENT_AMBULATORY_CARE_PROVIDER_SITE_OTHER): Payer: 59 | Admitting: Family Medicine

## 2012-04-04 ENCOUNTER — Encounter: Payer: Self-pay | Admitting: Family Medicine

## 2012-04-04 VITALS — BP 108/70 | Temp 98.1°F | Wt 112.0 lb

## 2012-04-04 DIAGNOSIS — I499 Cardiac arrhythmia, unspecified: Secondary | ICD-10-CM

## 2012-04-04 DIAGNOSIS — F410 Panic disorder [episodic paroxysmal anxiety] without agoraphobia: Secondary | ICD-10-CM

## 2012-04-04 DIAGNOSIS — I82409 Acute embolism and thrombosis of unspecified deep veins of unspecified lower extremity: Secondary | ICD-10-CM

## 2012-04-04 DIAGNOSIS — I2699 Other pulmonary embolism without acute cor pulmonale: Secondary | ICD-10-CM

## 2012-04-04 LAB — POCT INR: INR: 2.3

## 2012-04-04 NOTE — Progress Notes (Signed)
  Subjective:    Patient ID: Cynthia Delgado, female    DOB: [birth year], 46 y.o.   MRN: [id number]  HPI Cynthia Delgado 46 year old recently married female who comes in today for evaluation of 3 problems  She's had a history of a DVT with a PE and is on Coumadin. They increased her medication on Monday because her INR was 1.8 she would like another pro time today  She's also had a history of panic attacks and is on Celexa 20 mg daily and Klonopin when necessary  She had an episode on Monday that lasted about 4 hours she took her Klonopin and it resolved. We discussed various options we'll try to increase the Celexa to see if that will help ameliorate the panic attacks  She also has a history of palpitations she takes Corgard 20 mg daily and she had an episode this week while she was running. She describes as skipping. Cardiac evaluation the past has been negative she has a benign arrhythmia   Review of Systems    general pulmonary metabolic cardiovascular review of systems otherwise negative Objective:   Physical Exam Well-developed well-nourished thin female no acute distress cardiac exam again normal       Assessment & Plan:  A DVT with PE check protime  Palpitations increase Corgard to 40 mg daily  Panic attacks increase Celexa to 30 mg daily Klonopin when necessary followup in 6 weeks

## 2012-04-04 NOTE — Patient Instructions (Signed)
Increased the Celexa to 30 mg daily at bedtime  Increase the Corgard to 40 mg daily in the morning  Followup in 6 weeks sooner if any problems  Your INR today was 2.3 continue your current dose of Coumadin

## 2012-04-04 NOTE — Patient Instructions (Signed)
  Latest dosing instructions   Total Sun Mon Tue Wed Thu Fri Sat   37.5 5 mg 5 mg 5 mg 7.5 mg 5 mg 5 mg 5 mg    (5 mg1) (5 mg1) (5 mg1) (5 mg1.5) (5 mg1) (5 mg1) (5 mg1)

## 2012-04-09 ENCOUNTER — Telehealth: Payer: Self-pay | Admitting: *Deleted

## 2012-04-09 NOTE — Telephone Encounter (Signed)
Because she is on Coumadin these things are slow to heal sometimes,,,,,,,, 2 Tylenol 3 times a day elevation and ice in the evening use he takes 4-6 weeks for these things to quiet down,

## 2012-04-09 NOTE — Telephone Encounter (Signed)
Patient called because she fell 1 1/2 weeks ago.  She now has some bruising and soreness with her left knee/leg.  What symptoms should she watch for?

## 2012-04-09 NOTE — Telephone Encounter (Signed)
Spoke with patient.

## 2012-04-10 ENCOUNTER — Other Ambulatory Visit: Payer: Self-pay | Admitting: Family Medicine

## 2012-04-15 ENCOUNTER — Ambulatory Visit (INDEPENDENT_AMBULATORY_CARE_PROVIDER_SITE_OTHER): Payer: 59 | Admitting: Family

## 2012-04-15 DIAGNOSIS — I82409 Acute embolism and thrombosis of unspecified deep veins of unspecified lower extremity: Secondary | ICD-10-CM

## 2012-04-15 LAB — POCT INR: INR: 2.8

## 2012-04-15 NOTE — Patient Instructions (Addendum)
Continue Coumadin 5mg  a day except Wednesdays 7.5mg  (1 1/2 tab). Recheck in  3weeks.    Latest dosing instructions   Total Sun Mon Tue Wed Thu Fri Sat   37.5 5 mg 5 mg 5 mg 7.5 mg 5 mg 5 mg 5 mg    (5 mg1) (5 mg1) (5 mg1) (5 mg1.5) (5 mg1) (5 mg1) (5 mg1)

## 2012-05-06 ENCOUNTER — Encounter: Payer: 59 | Admitting: Family

## 2012-05-06 ENCOUNTER — Telehealth: Payer: Self-pay | Admitting: Family

## 2012-05-06 ENCOUNTER — Encounter: Payer: Self-pay | Admitting: Family Medicine

## 2012-05-06 ENCOUNTER — Ambulatory Visit (INDEPENDENT_AMBULATORY_CARE_PROVIDER_SITE_OTHER): Payer: 59 | Admitting: Family Medicine

## 2012-05-06 ENCOUNTER — Ambulatory Visit (INDEPENDENT_AMBULATORY_CARE_PROVIDER_SITE_OTHER): Payer: 59 | Admitting: Family

## 2012-05-06 VITALS — BP 104/68 | Temp 97.9°F | Wt 112.0 lb

## 2012-05-06 DIAGNOSIS — M519 Unspecified thoracic, thoracolumbar and lumbosacral intervertebral disc disorder: Secondary | ICD-10-CM | POA: Insufficient documentation

## 2012-05-06 DIAGNOSIS — I82409 Acute embolism and thrombosis of unspecified deep veins of unspecified lower extremity: Secondary | ICD-10-CM

## 2012-05-06 MED ORDER — TRAMADOL HCL 50 MG PO TABS: ORAL_TABLET | ORAL | Status: DC

## 2012-05-06 MED ORDER — DIAZEPAM 5 MG PO TABS: ORAL_TABLET | ORAL | Status: DC

## 2012-05-06 NOTE — Progress Notes (Signed)
  Subjective:    Patient ID: Cynthia Delgado, female    DOB: 25-Sep-1965, 47 y.o.   MRN: [id number]  HPI  Cynthia Delgado is a 47 year old recently married female who comes in today for evaluation of low back pain  She's had a long history of low back pain over the last for 5 years. Typically she takes some Motrin and it goes away. She did have a trampoline accident when she was about 47 years of age.  This episode started about a month ago when she was doing yoga. Some days the pain is constant other days he'll come and go. She describes as a dull pain and when it's at its worst is a 6 on a scale of 1-10. It seems to be central and sometimes will radiate down her left leg to her left great toe sometimes will radiate down her right leg but not to her foot. She has no bowel or bladder dysfunction.  Review of Systems General and neurologic review of systems otherwise negative except she recently increased her Corgard to 40 mg daily because her heart was skipping a lot.    Objective:   Physical Exam  Well-developed well-nourished female no acute distress examination spine is normal. In the supine both legs were of equal length except the left leg is the eighth of an inch shorter than the right which is not significant. The sensation muscle strength reflexes straight leg raising all within normal limits.  Cardiac exam normal no arrhythmia today      Assessment & Plan:  Low back pain plan Valium and tramadol each bedtime return when necessary  Corgard 40 mg daily split dose for palpitations

## 2012-05-06 NOTE — Patient Instructions (Addendum)
Continue Coumadin 5mg  a day except Wednesdays 7.5mg  (1 1/2 tab). Recheck in  4 weeks.    Latest dosing instructions   Total Sun Mon Tue Wed Thu Fri Sat   37.5 5 mg 5 mg 5 mg 7.5 mg 5 mg 5 mg 5 mg    (5 mg1) (5 mg1) (5 mg1) (5 mg1.5) (5 mg1) (5 mg1) (5 mg1)

## 2012-05-06 NOTE — Patient Instructions (Signed)
Valium and tramadol......... one half of each bedtime for low back pain  Corgard 20 mg........Marland Kitchen 1 in the morning and one at bedtime  Return when necessary

## 2012-05-06 NOTE — Telephone Encounter (Signed)
Pt aware and appt is ok

## 2012-05-06 NOTE — Telephone Encounter (Signed)
Please advise 4 week recheck. I scheduled Feb 10th at 2pm. If that does not work, please schedule accordingly.

## 2012-05-15 ENCOUNTER — Telehealth: Payer: Self-pay | Admitting: Family Medicine

## 2012-05-15 NOTE — Telephone Encounter (Signed)
Dr Todd spoke with patient 

## 2012-05-15 NOTE — Telephone Encounter (Signed)
Patient Information:  Caller Name: Westyn  Phone: [phone]  Patient: Cynthia Delgado, Cynthia Delgado  Gender: Female  DOB: 1965-12-22  Age: 47 Years  PCP: Kelle Darting [facility])  Pregnant: No  Office Follow Up:  Does the office need to follow up with this patient?: Yes  Instructions For The Office: See RN Note Section.  RN Note:  There are no available appointments today. Patient is requesting someone call her back and talk to her about the symptoms before scheduling an appointment. She has seen the doctor regarding back pain recently, but is concerned that this may be another DVT. Please call her back regarding this. Thanks.  Symptoms  Reason For Call & Symptoms: Aching left leg pain from the knee down, mainly centered in the calf. Patient has history of DVT in the right leg. She reports that this feels a little like it did when she had the DVT. Reports pain has started waking her up through the night.  Reviewed Health History In EMR: Yes  Reviewed Medications In EMR: Yes  Reviewed Allergies In EMR: Yes  Reviewed Surgeries / Procedures: Yes  Date of Onset of Symptoms: 04/17/2012  Treatments Tried: Tramadol, Valium  Treatments Tried Worked: No OB / GYN:  LMP: Unknown  Guideline(s) Used:  Leg Pain  Disposition Per Guideline:   Go to ED Now (or to Office with PCP Approval)  Reason For Disposition Reached:   Thigh or calf pain in only one leg and present > 1 hour  Advice Given:  N/A

## 2012-05-16 ENCOUNTER — Ambulatory Visit: Payer: 59 | Admitting: Family Medicine

## 2012-06-03 ENCOUNTER — Ambulatory Visit (INDEPENDENT_AMBULATORY_CARE_PROVIDER_SITE_OTHER): Payer: 59 | Admitting: Family

## 2012-06-03 DIAGNOSIS — I82409 Acute embolism and thrombosis of unspecified deep veins of unspecified lower extremity: Secondary | ICD-10-CM

## 2012-06-03 NOTE — Patient Instructions (Addendum)
Continue Coumadin 5mg  a day except Wednesdays 7.5mg  (1 1/2 tab). Recheck in  6 weeks.  Anticoagulation Dose Instructions as of 06/03/2012     Glynis Smiles Tue Wed Thu Fri Sat   New Dose 5 mg 5 mg 5 mg 7.5 mg 5 mg 5 mg 5 mg    Description       Continue Coumadin 5mg  a day except Wednesdays 7.5mg  (1 1/2 tab). Recheck in  6 weeks.

## 2012-06-08 ENCOUNTER — Other Ambulatory Visit: Payer: Self-pay

## 2012-07-15 ENCOUNTER — Ambulatory Visit (INDEPENDENT_AMBULATORY_CARE_PROVIDER_SITE_OTHER): Payer: 59 | Admitting: Family

## 2012-07-15 DIAGNOSIS — I82402 Acute embolism and thrombosis of unspecified deep veins of left lower extremity: Secondary | ICD-10-CM

## 2012-07-15 DIAGNOSIS — I82409 Acute embolism and thrombosis of unspecified deep veins of unspecified lower extremity: Secondary | ICD-10-CM

## 2012-07-15 NOTE — Patient Instructions (Addendum)
Continue Coumadin 5mg  a day except Wednesdays 7.5mg  (1 1/2 tab). Recheck in  6 weeks.  Anticoagulation Dose Instructions as of 07/15/2012     Glynis Smiles Tue Wed Thu Fri Sat   New Dose 5 mg 5 mg 5 mg 7.5 mg 5 mg 5 mg 5 mg    Description       Continue Coumadin 5mg  a day except Wednesdays 7.5mg  (1 1/2 tab). Recheck in  6 weeks.

## 2012-07-30 ENCOUNTER — Other Ambulatory Visit: Payer: Self-pay

## 2012-07-30 DIAGNOSIS — Z1231 Encounter for screening mammogram for malignant neoplasm of breast: Secondary | ICD-10-CM

## 2012-08-06 ENCOUNTER — Telehealth: Payer: Self-pay | Admitting: Family Medicine

## 2012-08-06 NOTE — Telephone Encounter (Signed)
Spoke with patient and her symptoms have been going on about a week.  She has tingling and acheness  with both legs beneath the knee.  If feels like her "legs are going to sleep". The symptoms increase with sitting.  She is concerned that this may be a circulation problem or a back problem.  Please advise.

## 2012-08-06 NOTE — Telephone Encounter (Signed)
Pt has tingling in her lower legs and aching. Pt has history of DVT and wants Dr Tawanna Cooler to know these symptoms to decide if she should be concerned. Pt states this is not like her symptoms when she had a DVT, just not sure. Pt REFUSED triage nurse, stating she is instructed to call [facility] w/ any concerns she has per Dr Tawanna Cooler.

## 2012-08-06 NOTE — Telephone Encounter (Signed)
Pt is sch for noon on 08-08-12

## 2012-08-06 NOTE — Telephone Encounter (Signed)
Per Dr Tawanna Cooler - office visit.  Left message on machine for patient to schedule an appointment Wednesday or Thursday.

## 2012-08-08 ENCOUNTER — Ambulatory Visit (INDEPENDENT_AMBULATORY_CARE_PROVIDER_SITE_OTHER): Payer: 59 | Admitting: Family Medicine

## 2012-08-08 ENCOUNTER — Encounter: Payer: Self-pay | Admitting: Family Medicine

## 2012-08-08 VITALS — BP 110/70 | Temp 98.1°F | Wt 116.0 lb

## 2012-08-08 DIAGNOSIS — M519 Unspecified thoracic, thoracolumbar and lumbosacral intervertebral disc disorder: Secondary | ICD-10-CM

## 2012-08-08 NOTE — Progress Notes (Signed)
  Subjective:    Patient ID: Cynthia Delgado, female    DOB: [birth year], 47 y.o.   MRN: [id number]  HPI Cynthia Delgado is a 47 year old female married nonsmoker who comes in today for evaluation of bilateral calf pain  She states about 2 weeks ago she began having bilateral calf pain typically associated with sitting. It was diminished by walking. She also has decreased her exercise program for the past couple weeks. She enjoys running but has had to stop because her back is been sore. 2 days ago she restarted her running program.  Around 2005 referred her to Dr. Derenda Fennel had [facility] orthopedics for evaluation of back pain. At that time MRI shows some degenerative disc she had some epidural steroid injections but it didn't seem to help all that much. She's been treating it symptomatically. However she continues to run for exercise.  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 She'll need to take this until September   Review of Systems    review of systems otherwise negative Objective:   Physical Exam  Well-developed well nourished female no acute distress examination of the legs and the supine positions shows the legs to be of equal length. Skin normal pulses normal neurologic exam including sensation muscle strength reflexes normal straight leg raising negative hips normal      Assessment & Plan:  Right and left lower calf pain secondary to lumbar disease plan Tylenol when necessary since she is on Coumadin tramadol each bedtime when necessary but really she needs to stop her exercise program and switch to things that are nonweightbearing like swimming

## 2012-08-08 NOTE — Patient Instructions (Signed)
Consider starting a swimming program 30 minutes daily in other nonweightbearing exercises  Return when necessary

## 2012-08-26 ENCOUNTER — Ambulatory Visit (INDEPENDENT_AMBULATORY_CARE_PROVIDER_SITE_OTHER): Payer: 59 | Admitting: Family

## 2012-08-26 DIAGNOSIS — I82402 Acute embolism and thrombosis of unspecified deep veins of left lower extremity: Secondary | ICD-10-CM

## 2012-08-26 DIAGNOSIS — I82409 Acute embolism and thrombosis of unspecified deep veins of unspecified lower extremity: Secondary | ICD-10-CM

## 2012-08-26 LAB — POCT INR: INR: 2.3

## 2012-08-26 NOTE — Patient Instructions (Signed)
Continue Coumadin 5mg  a day except Wednesdays 7.5mg  (1 1/2 tab). Recheck in  6 weeks.  Anticoagulation Dose Instructions as of 08/26/2012     Glynis Smiles Tue Wed Thu Fri Sat   New Dose 5 mg 5 mg 5 mg 7.5 mg 5 mg 5 mg 5 mg    Description       Continue Coumadin 5mg  a day except Wednesdays 7.5mg  (1 1/2 tab). Recheck in  6 weeks.

## 2012-09-09 ENCOUNTER — Ambulatory Visit
Admission: RE | Admit: 2012-09-09 | Discharge: 2012-09-09 | Disposition: A | Discharge status: Home / Self Care | Payer: 59 | Source: Ambulatory Visit

## 2012-09-09 DIAGNOSIS — Z1231 Encounter for screening mammogram for malignant neoplasm of breast: Secondary | ICD-10-CM

## 2012-10-07 ENCOUNTER — Encounter: Payer: 59 | Admitting: Family

## 2012-10-08 ENCOUNTER — Telehealth: Payer: Self-pay | Admitting: Family

## 2012-10-08 NOTE — Telephone Encounter (Signed)
appt scheduled for 10/09/12 at 1:30pm

## 2012-10-08 NOTE — Telephone Encounter (Signed)
Needs INR checked asap!! 

## 2012-10-09 ENCOUNTER — Ambulatory Visit (INDEPENDENT_AMBULATORY_CARE_PROVIDER_SITE_OTHER): Payer: 59 | Admitting: Family

## 2012-10-09 DIAGNOSIS — I82402 Acute embolism and thrombosis of unspecified deep veins of left lower extremity: Secondary | ICD-10-CM

## 2012-10-09 DIAGNOSIS — I82409 Acute embolism and thrombosis of unspecified deep veins of unspecified lower extremity: Secondary | ICD-10-CM

## 2012-10-09 NOTE — Patient Instructions (Addendum)
Continue Coumadin 5mg  a day except Wednesdays 7.5mg  (1 1/2 tab). Recheck in  6 weeks.  Anticoagulation Dose Instructions as of 10/09/2012     Glynis Smiles Tue Wed Thu Fri Sat   New Dose 5 mg 5 mg 5 mg 7.5 mg 5 mg 5 mg 5 mg    Description       Continue Coumadin 5mg  a day except Wednesdays 7.5mg  (1 1/2 tab). Recheck in  6 weeks.

## 2012-11-18 ENCOUNTER — Ambulatory Visit (INDEPENDENT_AMBULATORY_CARE_PROVIDER_SITE_OTHER): Payer: 59 | Admitting: General Practice

## 2012-11-18 DIAGNOSIS — I2699 Other pulmonary embolism without acute cor pulmonale: Secondary | ICD-10-CM

## 2012-11-18 LAB — POCT INR: INR: 1.9

## 2012-11-20 ENCOUNTER — Encounter: Payer: 59 | Admitting: Family

## 2012-12-04 ENCOUNTER — Encounter: Payer: Self-pay | Admitting: Family

## 2012-12-04 ENCOUNTER — Ambulatory Visit (INDEPENDENT_AMBULATORY_CARE_PROVIDER_SITE_OTHER): Payer: 59 | Admitting: Family

## 2012-12-04 VITALS — BP 108/60 | HR 61 | Wt 113.0 lb

## 2012-12-04 DIAGNOSIS — Z79899 Other long term (current) drug therapy: Secondary | ICD-10-CM

## 2012-12-04 DIAGNOSIS — H9311 Tinnitus, right ear: Secondary | ICD-10-CM

## 2012-12-04 DIAGNOSIS — I82409 Acute embolism and thrombosis of unspecified deep veins of unspecified lower extremity: Secondary | ICD-10-CM

## 2012-12-04 DIAGNOSIS — H9319 Tinnitus, unspecified ear: Secondary | ICD-10-CM

## 2012-12-04 DIAGNOSIS — I82401 Acute embolism and thrombosis of unspecified deep veins of right lower extremity: Secondary | ICD-10-CM

## 2012-12-04 NOTE — Progress Notes (Signed)
Subjective:    Patient ID: Cynthia Delgado, female    DOB: [birth year], 47 y.o.   MRN: [id number]  HPI 47 year old white female, nonsmoker, patient of Dr. Tawanna Cooler up in today with complaints of ranging in her right ear when she woke up this morning. She's concerned because she is about to go out of town. Has a history of cerumen impaction. Denies any sneezing, coughing or congestion.  Patient is also on Coumadin for DVT and PE sustained after her plane ride. Her discontinuation date is September 29.   Review of Systems  Constitutional: Negative.   HENT: Positive for tinnitus. Negative for hearing loss, ear pain, nosebleeds, rhinorrhea, postnasal drip and ear discharge.   Respiratory: Negative.   Cardiovascular: Negative.   Gastrointestinal: Negative.   Endocrine: Negative.   Genitourinary: Negative.   Musculoskeletal: Negative.   Allergic/Immunologic: Negative.   Neurological: Negative.   Psychiatric/Behavioral: Negative.    Past Medical History  Diagnosis Date  . MHA (microangiopathic hemolytic anemia)   . DUB (dysfunctional uterine bleeding)   . Miscarriage Sept 2008  . Depression   . Palpitations     History   Social History  . Marital Status: Single    Spouse Name: N/A    Number of Children: N/A  . Years of Education: N/A   Occupational History  . Not on file.   Social History Main Topics  . Smoking status: Former Games developer  . Smokeless tobacco: Not on file  . Alcohol Use:   . Drug Use:   . Sexual Activity:    Other Topics Concern  . Not on file   Social History Narrative  . No narrative on file    History reviewed. No pertinent past surgical history.  Family History  Problem Relation Age of Onset  . Hypertension Mother   . Alzheimer's disease Father     Allergies  Allergen Reactions  . Codeine Phosphate     REACTION: side effects  . Metronidazole     REACTION: nausea,vomiting,diarrhea    Current Outpatient Prescriptions on File Prior to Visit   Medication Sig Dispense Refill  . citalopram (CELEXA) 20 MG tablet Take 1 tablet (20 mg total) by mouth daily.  100 tablet  3  . clonazePAM (KLONOPIN) 0.5 MG tablet TAKE ONE TABLET BY MOUTH THREE TIMES DAILY AS NEEDED FOR ANXIETY  90 tablet  5  . diazepam (VALIUM) 5 MG tablet One half to one tablet at bedtime when necessary  30 tablet  1  . HYPERCARE 20 % external solution APPLY TOPICALLY DAILY AS NEEDED  60 mL  2  . multivitamin (THERAGRAN) per tablet Take 1 tablet by mouth daily.        . nadolol (CORGARD) 20 MG tablet Take 1 tablet (20 mg total) by mouth 2 (two) times daily as needed. One p.o. B.i.d.  200 tablet  3  . traMADol (ULTRAM) 50 MG tablet 1/2-1 tablet at bedtime as needed for pain  50 tablet  2  . warfarin (COUMADIN) 5 MG tablet Take 1 tablet (5 mg total) by mouth daily.  100 tablet  3   No current facility-administered medications on file prior to visit.    BP 108/60  Pulse 61  Wt 113 lb (51.256 kg)  BMI 22.63 kg/m2chart    Objective:   Physical Exam  Constitutional: She is oriented to person, place, and time. She appears well-developed and well-nourished.  HENT:  Right Ear: External ear normal.  Nose: Nose normal.  Mouth/Throat:  Oropharynx is clear and moist.  Neck: Normal range of motion. Neck supple.  Cardiovascular: Normal rate, regular rhythm and normal heart sounds.   Pulmonary/Chest: Effort normal and breath sounds normal.  Musculoskeletal: Normal range of motion.  Neurological: She is alert and oriented to person, place, and time.  Skin: Skin is warm and dry.  Psychiatric: She has a normal mood and affect.          Assessment & Plan:  Assessment: 1. Tinnitus 2. Anxiety 3. High-risk medication usage 4. Deep vein thrombosis  Plan: Continue current medications. Return in September for discontinuation of Coumadin. Patient wishes to have an EKG done prior to discontinuation to be sure she didn't have atrial fibrillation given her past history of cardiac  arrhythmia. I will see her in the office for that.

## 2012-12-04 NOTE — Patient Instructions (Addendum)
Continue Coumadin 5mg  a day except Wednesdays 7.5mg  (1 1/2 tab). Recheck in  6 weeks.  Anticoagulation Dose Instructions as of 12/04/2012     Cynthia Delgado Tue Wed Thu Fri Sat   New Dose 5 mg 5 mg 5 mg 7.5 mg 5 mg 5 mg 5 mg    Description       Continue Coumadin 5mg  a day except Wednesdays 7.5mg  (1 1/2 tab). Recheck in  6 weeks.

## 2012-12-16 ENCOUNTER — Other Ambulatory Visit: Payer: Self-pay | Admitting: Family Medicine

## 2012-12-18 ENCOUNTER — Other Ambulatory Visit: Payer: 59

## 2012-12-19 ENCOUNTER — Ambulatory Visit: Payer: 59

## 2012-12-25 ENCOUNTER — Encounter: Payer: 59 | Admitting: Family Medicine

## 2013-01-17 ENCOUNTER — Encounter: Payer: Self-pay | Admitting: Family

## 2013-01-17 ENCOUNTER — Ambulatory Visit (INDEPENDENT_AMBULATORY_CARE_PROVIDER_SITE_OTHER): Payer: 59 | Admitting: Family

## 2013-01-17 ENCOUNTER — Ambulatory Visit (INDEPENDENT_AMBULATORY_CARE_PROVIDER_SITE_OTHER): Payer: 59 | Admitting: General Practice

## 2013-01-17 VITALS — BP 100/72 | HR 77 | Wt 117.0 lb

## 2013-01-17 DIAGNOSIS — I82403 Acute embolism and thrombosis of unspecified deep veins of lower extremity, bilateral: Secondary | ICD-10-CM

## 2013-01-17 DIAGNOSIS — I2699 Other pulmonary embolism without acute cor pulmonale: Secondary | ICD-10-CM

## 2013-01-17 DIAGNOSIS — R002 Palpitations: Secondary | ICD-10-CM

## 2013-01-17 DIAGNOSIS — I82409 Acute embolism and thrombosis of unspecified deep veins of unspecified lower extremity: Secondary | ICD-10-CM

## 2013-01-17 DIAGNOSIS — Z23 Encounter for immunization: Secondary | ICD-10-CM

## 2013-01-17 LAB — POCT INR: INR: 2.9

## 2013-01-17 NOTE — Patient Instructions (Addendum)
1. Discontinue Coumadin after Monday.

## 2013-01-17 NOTE — Progress Notes (Signed)
Subjective:    Patient ID: Cynthia Delgado, female    DOB: 1965-08-11, 47 y.o.   MRN: [id number]  HPI  47 year old white female, nonsmoker, patient of Dr. Tawanna Cooler is in today to have Coumadin discontinued. She's been on therapy one year this coming Monday. INRs have been pretty stable. Reports having an occasional palpitation like an EKG performed. Denies any chest pain, shortness of breath or edema. Has a history of palpitations for which she takes a beta blocker for.   Review of Systems  Constitutional: Negative.   Respiratory: Negative.   Cardiovascular: Positive for palpitations. Negative for chest pain and leg swelling.  Gastrointestinal: Negative.   Endocrine: Negative.   Musculoskeletal: Negative.   Skin: Negative.   Neurological: Negative.   Hematological: Negative.   Psychiatric/Behavioral: Negative.    Past Medical History  Diagnosis Date  . MHA (microangiopathic hemolytic anemia)   . DUB (dysfunctional uterine bleeding)   . Miscarriage Sept 2008  . Depression   . Palpitations     History   Social History  . Marital Status: Single    Spouse Name: N/A    Number of Children: N/A  . Years of Education: N/A   Occupational History  . Not on file.   Social History Main Topics  . Smoking status: Former Games developer  . Smokeless tobacco: Not on file  . Alcohol Use:   . Drug Use:   . Sexual Activity:    Other Topics Concern  . Not on file   Social History Narrative  . No narrative on file    No past surgical history on file.  Family History  Problem Relation Age of Onset  . Hypertension Mother   . Alzheimer's disease Father     Allergies  Allergen Reactions  . Codeine Phosphate     REACTION: side effects  . Metronidazole     REACTION: nausea,vomiting,diarrhea    Current Outpatient Prescriptions on File Prior to Visit  Medication Sig Dispense Refill  . citalopram (CELEXA) 20 MG tablet Take 1 tablet (20 mg total) by mouth daily.  100 tablet  3  .  clonazePAM (KLONOPIN) 0.5 MG tablet TAKE ONE TABLET BY MOUTH THREE TIMES DAILY AS NEEDED FOR ANXIETY  90 tablet  5  . diazepam (VALIUM) 5 MG tablet One half to one tablet at bedtime when necessary  30 tablet  1  . HYPERCARE 20 % external solution APPLY TOPICALLY DAILY AS NEEDED  60 mL  2  . multivitamin (THERAGRAN) per tablet Take 1 tablet by mouth daily.        . nadolol (CORGARD) 20 MG tablet TAKE 1 TABLET BY MOUTH TWICE DAILY AS NEEDED  200 tablet  0  . traMADol (ULTRAM) 50 MG tablet 1/2-1 tablet at bedtime as needed for pain  50 tablet  2  . warfarin (COUMADIN) 5 MG tablet Take 1 tablet (5 mg total) by mouth daily.  100 tablet  3   No current facility-administered medications on file prior to visit.    BP 100/72  Pulse 77  Wt 117 lb (53.071 kg)  BMI 23.43 kg/m2chart    Objective:   Physical Exam  Constitutional: She is oriented to person, place, and time. She appears well-developed and well-nourished.  HENT:  Right Ear: External ear normal.  Left Ear: External ear normal.  Nose: Nose normal.  Mouth/Throat: Oropharynx is clear and moist.  Neck: Normal range of motion. Neck supple. No thyromegaly present.  Cardiovascular: Normal rate, regular rhythm and normal  heart sounds.   No murmur heard. Abdominal: Soft.  Neurological: She is alert and oriented to person, place, and time.  Skin: Skin is warm and dry.  Psychiatric: She has a normal mood and affect.      EKG: WNL    Assessment & Plan:  Assessment:  1. Cardiac arrhythmia 2. DVT  Plan: DC Coumadin effect of Monday. Her sodium diet, exercise. With traveling, advised frequent standing and walking to be sure that she does in coagulate. No more birth control pills.

## 2013-01-17 NOTE — Addendum Note (Signed)
Addended by: Beverely Low on: 01/17/2013 10:56 AM   Modules accepted: Orders

## 2013-02-05 ENCOUNTER — Ambulatory Visit: Payer: Self-pay | Admitting: Family Medicine

## 2013-02-05 ENCOUNTER — Telehealth: Payer: Self-pay | Admitting: Family Medicine

## 2013-02-05 ENCOUNTER — Encounter: Payer: Self-pay | Admitting: Family Medicine

## 2013-02-05 ENCOUNTER — Other Ambulatory Visit: Payer: Self-pay | Admitting: *Deleted

## 2013-02-05 ENCOUNTER — Ambulatory Visit (INDEPENDENT_AMBULATORY_CARE_PROVIDER_SITE_OTHER): Payer: BC Managed Care – PPO | Admitting: Family Medicine

## 2013-02-05 VITALS — BP 124/80 | Temp 98.3°F | Wt 119.0 lb

## 2013-02-05 DIAGNOSIS — S93409A Sprain of unspecified ligament of unspecified ankle, initial encounter: Secondary | ICD-10-CM

## 2013-02-05 DIAGNOSIS — R071 Chest pain on breathing: Secondary | ICD-10-CM

## 2013-02-05 DIAGNOSIS — R0789 Other chest pain: Secondary | ICD-10-CM | POA: Insufficient documentation

## 2013-02-05 DIAGNOSIS — S93401A Sprain of unspecified ligament of right ankle, initial encounter: Secondary | ICD-10-CM | POA: Insufficient documentation

## 2013-02-05 MED ORDER — CLONAZEPAM 0.5 MG PO TABS: ORAL_TABLET | ORAL | Status: DC

## 2013-02-05 NOTE — Telephone Encounter (Signed)
Okay to work in at the end of the morning

## 2013-02-05 NOTE — Telephone Encounter (Signed)
Pt states that she recently went off of her coumadin therapy, and that she is experiencing pain at her pervious dvt site. You have no available appointment slots today, please advise for scheduling.

## 2013-02-05 NOTE — Telephone Encounter (Signed)
Patient Information:  Caller Name: Sanaya  Phone: [phone]  Patient: Cynthia Delgado, Cynthia Delgado  Gender: Female  DOB: [birth year]  Age: 47 Years  PCP: Kelle Darting [facility])  Pregnant: No  Office Follow Up:  Does the office need to follow up with this patient?: Yes if you want to change the appointment time!   Instructions For The Office  Appointment scheduled with Dr. Caryl Never for this afternoon.     Symptoms  Reason For Call & Symptoms: Patient calling about pain in entire right leg.  History of DVT in same leg and completed Coumadin therapy 01/22/13.  She also relates she twisted ankle 02/01/13; history of back problems that sends pain into legs.  Pain rated at 2-3 of 10; aching similar to DVT and also similar to when her back flares up.  Emergent symptoms ruled out   Go to ED Now or to Office with PCP Approval per Leg Pain protocol due to History of prior "blood clot" in leg or lungs.  Patient insists on appointment at office.  Reviewed Health History In EMR: Yes  Reviewed Medications In EMR: Yes  Reviewed Allergies In EMR: Yes  Reviewed Surgeries / Procedures: Yes  Date of Onset of Symptoms: 02/04/2013  Treatments Tried: Ibuprofen  Treatments Tried Worked: No OB / GYN:  LMP: 01/16/2013  Guideline(s) Used:  Leg Injury  Leg Pain  Disposition Per Guideline:   Go to ED Now (or to Office with PCP Approval)  Reason For Disposition Reached:   History of prior "blood clot" in leg or lungs (i.e., deep vein thrombosis, pulmonary embolism)  Advice Given:  Reassurance - Bending or Twisting Injury (Strain, Sprain):  Strain and sprain are the medical terms used to describe over-stretching of the muscles and ligaments of the leg. A twisting or bending injury can cause a strain or sprain.  The main symptom is pain that is worse with movement and walking. Swelling can occur. Rarely there may be slight bruising.  Here is some care advice that should help.  Apply a Cold Pack:  Apply a  cold pack or an ice bag (wrapped in a moist towel) to the area for 20 minutes. Repeat in 1 hour, then every 4 hours while awake.  Continue this for the first 48 hours after an injury.  Apply Heat to the Area:  Beginning 48 hours after an injury, apply a warm washcloth or heating pad for 10 minutes three times a day.  This will help increase blood flow and improve healing.  Elevate the Leg:  Lay down and put your leg on a pillow. This puts (elevates) the leg above the heart.  Do this for 15-20 minutes, 2-3 times a day, for the first two days.  This can also help decrease swelling, bruising, and pain.  Pain Medicines:  For pain relief, you can take either acetaminophen, ibuprofen, or naproxen.  Rest:   You should try to avoid any exercise or activity that caused this pain for the next 3 days.  RN Overrode Recommendation:  Make Appointment  Patient insists on office visit.  Appointment Scheduled:  02/05/2013 13:45:00 Appointment Scheduled Provider:  Evelena Peat [facility])

## 2013-02-05 NOTE — Patient Instructions (Signed)
For the chest wall pain and the sore ankle and leg I would recommend Motrin 600 mg twice daily with food  Return when necessary

## 2013-02-05 NOTE — Progress Notes (Signed)
  Subjective:    Patient ID: Cynthia Delgado, female    DOB: 05-06-1965, 47 y.o.   MRN: [id number]  HPI Cynthia Delgado is a 47 year old married female nonsmoker who comes in today for evaluation of 2 problems  Over the weekend she twisted her right ankle and notices some discomfort but wasn't severe. Over the next couple days she noticed the pain going up to her calf and up in her side. She took 800 mg of Motrin today and he feels much better.  She's also some sharp midsternal chest wall pain when she takes a deep breath it comes and goes. Cardiopulmonary review of systems otherwise negative  She has had a history of a DVT and a PE. She stopped her Coumadin after 12 months on October 1. She does take an aspirin tablet daily   Review of Systems    review of systems otherwise negative periods normal birth control condoms Objective:   Physical Exam  Well-developed and nourished female no acute distress examination of chest shows some tenderness mid to lower sternal cardiopulmonary evaluation negative. Abdominal exam negative extremities normal skin normal peripheral pulses normal Homans sign is negative      Assessment & Plan:  Right ankle sprain  Chest wall pain reassured return when necessary

## 2013-02-18 ENCOUNTER — Other Ambulatory Visit: Payer: 59

## 2013-02-25 ENCOUNTER — Encounter: Payer: 59 | Admitting: Family Medicine

## 2013-06-05 ENCOUNTER — Other Ambulatory Visit: Payer: Self-pay | Admitting: Family Medicine

## 2013-08-25 ENCOUNTER — Other Ambulatory Visit: Payer: Self-pay | Admitting: Family Medicine

## 2013-09-23 ENCOUNTER — Other Ambulatory Visit: Payer: Self-pay

## 2013-09-23 DIAGNOSIS — Z1231 Encounter for screening mammogram for malignant neoplasm of breast: Secondary | ICD-10-CM

## 2013-09-25 ENCOUNTER — Ambulatory Visit (INDEPENDENT_AMBULATORY_CARE_PROVIDER_SITE_OTHER): Payer: BC Managed Care – PPO | Admitting: Family Medicine

## 2013-09-25 ENCOUNTER — Encounter: Payer: Self-pay | Admitting: Family Medicine

## 2013-09-25 VITALS — BP 110/70 | Temp 97.8°F | Wt 115.0 lb

## 2013-09-25 DIAGNOSIS — R209 Unspecified disturbances of skin sensation: Secondary | ICD-10-CM

## 2013-09-25 LAB — BASIC METABOLIC PANEL
BUN: 11 mg/dL (ref 6–23)
CO2: 27 meq/L (ref 19–32)
Calcium: 9.4 mg/dL (ref 8.4–10.5)
Chloride: 105 mEq/L (ref 96–112)
Creatinine, Ser: 0.6 mg/dL (ref 0.4–1.2)
GFR: 111.9 mL/min (ref >60.00)
Glucose, Bld: 95 mg/dL (ref 70–99)
POTASSIUM: 4 meq/L (ref 3.5–5.1)
SODIUM: 139 meq/L (ref 135–145)

## 2013-09-25 LAB — HEPATIC FUNCTION PANEL
ALK PHOS: 35 U/L — AB (ref 39–117)
ALT: 20 U/L (ref 0–35)
AST: 20 U/L (ref 0–37)
Albumin: 4.1 g/dL (ref 3.5–5.2)
BILIRUBIN DIRECT: 0 mg/dL (ref 0.0–0.3)
TOTAL PROTEIN: 6.7 g/dL (ref 6.0–8.3)
Total Bilirubin: 0.7 mg/dL (ref 0.2–1.2)

## 2013-09-25 LAB — CBC WITH DIFFERENTIAL/PLATELET
BASOS PCT: 0.6 % (ref 0.0–3.0)
Basophils Absolute: 0 10*3/uL (ref 0.0–0.1)
Eosinophils Absolute: 0.2 10*3/uL (ref 0.0–0.7)
Eosinophils Relative: 2.2 % (ref 0.0–5.0)
HCT: 37.9 % (ref 36.0–46.0)
Hemoglobin: 12.7 g/dL (ref 12.0–15.0)
LYMPHS PCT: 27.8 % (ref 12.0–46.0)
Lymphs Abs: 2.1 10*3/uL (ref 0.7–4.0)
MCHC: 33.5 g/dL (ref 30.0–36.0)
MCV: 99.3 fl (ref 78.0–100.0)
Monocytes Absolute: 0.7 10*3/uL (ref 0.1–1.0)
Monocytes Relative: 8.8 % (ref 3.0–12.0)
NEUTROS PCT: 60.6 % (ref 43.0–77.0)
Neutro Abs: 4.6 10*3/uL (ref 1.4–7.7)
Platelets: 285 10*3/uL (ref 150.0–400.0)
RBC: 3.82 Mil/uL — AB (ref 3.87–5.11)
RDW: 13.2 % (ref 11.5–15.5)
WBC: 7.6 10*3/uL (ref 4.0–10.5)

## 2013-09-25 LAB — FOLATE

## 2013-09-25 LAB — IRON: Iron: 115 ug/dL (ref 42–145)

## 2013-09-25 LAB — VITAMIN B12: Vitamin B-12: 435 pg/mL (ref 211–911)

## 2013-09-25 LAB — CALCIUM: Calcium: 9.4 mg/dL (ref 8.4–10.5)

## 2013-09-25 LAB — FERRITIN: FERRITIN: 64.7 ng/mL (ref 10.0–291.0)

## 2013-09-25 LAB — TSH: TSH: 0.83 u[IU]/mL (ref 0.35–4.50)

## 2013-09-25 NOTE — Patient Instructions (Signed)
Labs today  I will call you next week when I gets her lab work back  No alcohol

## 2013-09-25 NOTE — Progress Notes (Signed)
   Subjective:    Patient ID: Cynthia Delgado, female    DOB: [birth year], 48 y.o.   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 Both neurologic evaluation for negative and the neuropathy went away. It recurred this past August and then went away it then recurred in February and aspirin persistent since that time. It involves her hands and feet it's a sensation of aching and tingling  She states she otherwise feels neurologically well. No history of trauma.  No headache blurred vision trouble with balance etc. etc.  Metabolic review of systems negative dietary review of systems negative substance abuse negative  She drinks 2 glasses of wine per day sometimes none and wants to know if this could be a variable in the neuropathy   Review of Systems    review of systems otherwise negative Objective:   Physical Exam  Well-developed well-nourished female no acute distress vital signs stable she's afebrile weight 1:15 BP 110/70  Examination of the extremities were normal in appearance. There is a different sensation between the palms of the hands in the upper. She says it feels team daily and hypersensitive. The skin is normal reflexes normal gait normal muscle strength normal      Assessment & Plan:  Peripheral neuropathy...Marland KitchenMarland KitchenMarland Kitchen check labs...Marland KitchenMarland KitchenMarland Kitchen stop alcohol completely...Marland KitchenMarland Kitchen neuro consult.

## 2013-09-25 NOTE — Progress Notes (Signed)
Pre visit review using our clinic review tool, if applicable. No additional management support is needed unless otherwise documented below in the visit note. 

## 2013-10-06 ENCOUNTER — Ambulatory Visit
Admission: RE | Admit: 2013-10-06 | Discharge: 2013-10-06 | Disposition: A | Discharge status: Home / Self Care | Payer: BC Managed Care – PPO | Source: Ambulatory Visit

## 2013-10-06 DIAGNOSIS — Z1231 Encounter for screening mammogram for malignant neoplasm of breast: Secondary | ICD-10-CM

## 2013-10-07 ENCOUNTER — Telehealth: Payer: Self-pay | Admitting: Family Medicine

## 2013-10-07 DIAGNOSIS — G629 Polyneuropathy, unspecified: Secondary | ICD-10-CM

## 2013-10-07 NOTE — Telephone Encounter (Signed)
Pt is wanting to know if results are in from her labs that was done 2 wks ago.

## 2013-10-07 NOTE — Telephone Encounter (Signed)
Left message on machine for patient to returning her call. 

## 2013-10-08 DIAGNOSIS — G629 Polyneuropathy, unspecified: Secondary | ICD-10-CM | POA: Insufficient documentation

## 2013-10-08 NOTE — Telephone Encounter (Signed)
Patient is aware of lab results and referral placed 

## 2013-11-12 ENCOUNTER — Ambulatory Visit (INDEPENDENT_AMBULATORY_CARE_PROVIDER_SITE_OTHER): Payer: BC Managed Care – PPO | Admitting: Neurology

## 2013-11-12 ENCOUNTER — Encounter: Payer: Self-pay | Admitting: Neurology

## 2013-11-12 VITALS — BP 104/64 | HR 72 | Ht 59.45 in | Wt 114.3 lb

## 2013-11-12 DIAGNOSIS — R202 Paresthesia of skin: Secondary | ICD-10-CM

## 2013-11-12 DIAGNOSIS — R209 Unspecified disturbances of skin sensation: Secondary | ICD-10-CM

## 2013-11-12 NOTE — Patient Instructions (Signed)
1.  Check blood work  2.  Please contact my office if you would like to move forward with the nerve conduction study 3.  Continue alpha lipoic 300mg  daily 4.  Try to reduce alcohol intake 5.  Return to clinic in 2135-month

## 2013-11-12 NOTE — Progress Notes (Signed)
[facility] Clinic Note - Initial Visit   Date: 11/12/2013  Cynthia Delgado MRN: [id number] DOB: [birth year]   Dear Dr Sherren Mocha:  Thank you for your kind referral of Cynthia Delgado for consultation of neuropathy. Although her history is well known to you, please allow [place] to reiterate it for the purpose of our medical record. The patient was accompanied to the clinic by self.    History of Present Illness: Cynthia Delgado is a 48 y.o. right-handed Caucasian female with history of anxiety/depression and palpitations presenting for evaluation of tingling of her hands and feet.    In 2011, she developing tingling and aching of the hands and feet was evaluated at [facility] and underwent MRI brain, MRI cervical spine, and NCS/EMG which was normal.  At that time, she has a band-like sensation starting at the base of the head and moves to the front.  She was have associated effortful voluntary movements such as drinking coffee or using her ATM.  All of her symptoms resolved spontaneously in 2012.  She has been doing well until August 2014 when she developing tingling of her hands and feet, which resolved with a few weeks. In February 2015, she having achy and tingling sensation of the palms of her hands and soles of her feet.  Symptoms are daily and lasts a few weeks.  She denies any weakness or numbness.   Denies triggers, exacerbating, or alleviating factors.  In June 2015, she started taking alpha-lipoic acid 66m which seemed to help her symptoms.  She reduced the dose 309mthis week and still feels that her symptoms are improved.  Although her symptoms are improving, she reports that the duration of these spells are longer so went for evaluation by her primary care doctor who ordered TSH and vitamin B12 level, which was normal. She was referred here for further evaluation.   She has chronic low back and neck pain.     Out-side paper records, electronic medical record, and  images have been reviewed where available and summarized as:  MRI brain wwo contrast 03/04/2010: 1. Minimal cerebral white matter signal abnormality without enhancement is nonspecific (differential diagnosis small vessel  ischemia, sequelae of trauma, vasculitis, migraines, prior infection or demyelination), and while early demyelinating disease  cannot be excluded, these may well be incidental.  2. Otherwise normal MRI appearance of the brain.  MRI cervical spine wo contrast 10/22/2009: C5-6: Spondylosis and shallow protrusion of disc material. Foraminal encroachment, slightly more on the left than the right.  Left C6 nerve root could be irritated, but definite compression is not established.  C6-7: Spondylosis without significant canal or foraminal narrowing.  Lab Results  Component Value Date   TSH 0.83 09/25/2013   Lab Results  Component Value Date   [id number] 149/07/2013      Past Medical History  Diagnosis Date  . MHA (microangiopathic hemolytic anemia)   . DUB (dysfunctional uterine bleeding)   . Miscarriage Sept 2008  . Depression   . Palpitations     Past Surgical History  Procedure Laterality Date  . None       Medications:  Current Outpatient Prescriptions on File Prior to Visit  Medication Sig Dispense Refill  . clonazePAM (KLONOPIN) 0.5 MG tablet TAKE 1 TABLET BY MOUTH THREE TIMES DAILY AS NEEDED FOR ANXIETY  30 tablet  0  . HYPERCARE 20 % external solution APPLY TOPICALLY DAILY AS NEEDED  60 mL  2  . multivitamin (THERAGRAN) per  tablet Take 1 tablet by mouth daily.        . nadolol (CORGARD) 20 MG tablet TAKE 1 TABLET BY MOUTH TWICE DAILY AS NEEDED  200 tablet  0  . traMADol (ULTRAM) 50 MG tablet 1/2-1 tablet at bedtime as needed for pain  50 tablet  2  . traMADol (ULTRAM) 50 MG tablet TAKE 1/2 TO 1 TABLET BY MOUTH EVERY NIGHT AT BEDTIME AS NEEDED  30 tablet  5   No current facility-administered medications on file prior to visit.    Allergies:    Allergies  Allergen Reactions  . Codeine Phosphate     REACTION: side effects  . Metronidazole     REACTION: nausea,vomiting,diarrhea    Family History: Family History  Problem Relation Age of Onset  . Hypertension Mother     Deceased, 74  . Alzheimer's disease Father     Deceased, 59  . Stroke Mother   . Healthy Sister   . Healthy Brother     Social History: History   Social History  . Marital Status: Single    Spouse Name: N/A    Number of Children: N/A  . Years of Education: N/A   Occupational History  . Not on file.   Social History Main Topics  . Smoking status: Former Smoker -- 1.00 packs/day for 12 years    Types: Cigarettes    Quit date: 04/24/1996  . Smokeless tobacco: Not on file  . Alcohol Use: Yes     Comment: 8-12oz wine nightly  . Drug Use: No  . Sexual Activity: Not on file   Other Topics Concern  . Not on file   Social History Narrative   She lives with husband.   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   Highest level of education: B.S.W.    Review of Systems:  CONSTITUTIONAL: No fevers, chills, night sweats, or weight loss.   EYES: No visual changes or eye pain ENT: No hearing changes.  No history of nose bleeds.   RESPIRATORY: No cough, wheezing and shortness of breath.   CARDIOVASCULAR: Negative for chest pain, and palpitations.   GI: Negative for abdominal discomfort, blood in stools or black stools.  No recent change in bowel habits.   GU:  No history of incontinence.   MUSCLOSKELETAL: + history of joint pain or swelling.  No myalgias.   SKIN: Negative for lesions, rash, and itching.   HEMATOLOGY/ONCOLOGY: Negative for prolonged bleeding, bruising easily, and swollen nodes.   ENDOCRINE: Negative for cold or heat intolerance, polydipsia or goiter.   PSYCH:  No depression or anxiety symptoms.   NEURO: As Above.   Vital Signs:  BP 104/64  Pulse 72  Ht 4' 11.45" (1.51 m)  Wt 114 lb 5 oz (51.852 kg)  BMI 22.74 kg/m2   SpO2 99%   Neurological Exam: MENTAL STATUS including orientation to time, place, person, recent and remote memory, attention span and concentration, language, and fund of knowledge is normal.  Speech is not dysarthric.  CRANIAL NERVES: II:  No visual field defects.  Unremarkable fundi.   III-IV-VI: Pupils equal round and reactive to light.  Normal conjugate, extra-ocular eye movements in all directions of gaze.  No nystagmus.  No ptosis.   V:  Normal facial sensation.    VII:  Normal facial symmetry and movements.  No pathologic facial reflexes.  VIII:  Normal hearing and vestibular function.   IX-X:  Normal palatal movement.   XI:  Normal shoulder  shrug and head rotation.   XII:  Normal tongue strength and range of motion, no deviation or fasciculation.  MOTOR:  No atrophy, fasciculations or abnormal movements.  No pronator drift.  Tone is normal.    Right Upper Extremity:    Left Upper Extremity:    Deltoid  5/5   Deltoid  5/5   Biceps  5/5   Biceps  5/5   Triceps  5/5   Triceps  5/5   Wrist extensors  5/5   Wrist extensors  5/5   Wrist flexors  5/5   Wrist flexors  5/5   Finger extensors  5/5   Finger extensors  5/5   Finger flexors  5/5   Finger flexors  5/5   Dorsal interossei  5/5   Dorsal interossei  5/5   Abductor pollicis  5/5   Abductor pollicis  5/5   Tone (Ashworth scale)  0  Tone (Ashworth scale)  0   Right Lower Extremity:    Left Lower Extremity:    Hip flexors  5/5   Hip flexors  5/5   Hip extensors  5/5   Hip extensors  5/5   Knee flexors  5/5   Knee flexors  5/5   Knee extensors  5/5   Knee extensors  5/5   Dorsiflexors  5/5   Dorsiflexors  5/5   Plantarflexors  5/5   Plantarflexors  5/5   Toe extensors  5/5   Toe extensors  5/5   Toe flexors  5/5   Toe flexors  5/5   Tone (Ashworth scale)  0  Tone (Ashworth scale)  0   MSRs:  Right                                                                 Left brachioradialis 2+  brachioradialis 2+  biceps 2+   biceps 2+  triceps 2+  triceps 2+  patellar 2+  patellar 2+  ankle jerk 2+  ankle jerk 2+  Hoffman no  Hoffman no  plantar response down  plantar response down   SENSORY:  Normal and symmetric perception of light touch, pinprick, vibration, and proprioception.  Romberg's sign absent.   COORDINATION/GAIT: Normal finger-to- nose-finger and heel-to-shin.  Intact rapid alternating movements bilaterally.  Able to rise from a chair without using arms.  Gait narrow based and stable. Tandem and stressed gait intact.    IMPRESSION: Cynthia Delgado is a 48 year-old female presenting for evaluation of bilateral hand and feet paresthesias. Her neurological examination is entirely normal and nonfocal. She reports having similar spell of hand and feet tingling in 2011 at which time her neurological workup including MRI brain, MRI cervical spine, and electrodiagnostic testing was within normal limits. Her symptoms spontaneously improved until early 2015 when they returned. Since taking alpha lipoic acid supplements, her paresthesias have slightly improved.  In the setting of a normal examination and previous imaging, my suspicion for demyelinating disease is low. I do think it is reasonable to reassess her symptoms with electrodiagnostic testing, however she would like to hold off at this time. I will also order neuropathy labs including those for autoimmune disease given her age and episodic symptoms.  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  I discussed that in the vast majority of cases, despite checking for reversible causes, we are unable to find the underlying etiology and management is symptomatic.     PLAN/RECOMMENDATIONS:  1.  ESR, CRP, 2-hr glucose tolerance test, ANA, ENA, copper, ceruloplasmin, UPEP/SPEP with IFE 2.  NCS/EMG right arm and leg, if patient agreeable 3.  Continue alpha lipoic 328m daily 4.   Strongly encouraged to reduce alcohol in 2  5.  Return to clinic in 3 months.   The duration of this appointment visit was 45 minutes of face-to-face time with the patient.  Greater than 50% of this time was spent in counseling, explanation of diagnosis, planning of further management, and coordination of care.   Thank you for allowing me to participate in patient's care.  If I can answer any additional questions, I would be pleased to do so.    Sincerely,    Josiephine Simao K. PPosey Pronto DO

## 2013-11-13 LAB — ENA 9 PANEL
CENTROMERE AB SCREEN: NEGATIVE
ENA SM AB SER-ACNC: NEGATIVE
Jo-1 Antibody, IgG: <1
Ribosomal P Protein Ab: <1
SM/RNP: <1
SSA (RO) (ENA) ANTIBODY, IGG: NEGATIVE
SSB (La) (ENA) Antibody, IgG: <1
Scleroderma (Scl-70) (ENA) Antibody, IgG: <1
ds DNA Ab: <1 IU/mL

## 2013-11-13 LAB — C-REACTIVE PROTEIN: CRP: <0.5 mg/dL (ref <0.60)

## 2013-11-13 LAB — SEDIMENTATION RATE: Sed Rate: 1 mm/hr (ref 0–22)

## 2013-11-13 LAB — ANA: Anti Nuclear Antibody(ANA): NEGATIVE

## 2013-11-14 LAB — SPEP & IFE WITH QIG
ALBUMIN ELP: 65.2 % (ref 55.8–66.1)
ALPHA-1-GLOBULIN: 4 % (ref 2.9–4.9)
ALPHA-2-GLOBULIN: 8.2 % (ref 7.1–11.8)
BETA 2: 3.9 % (ref 3.2–6.5)
BETA GLOBULIN: 6.3 % (ref 4.7–7.2)
Gamma Globulin: 12.4 % (ref 11.1–18.8)
IGA: 148 mg/dL (ref 69–380)
IgG (Immunoglobin G), Serum: 932 mg/dL (ref 690–1700)
IgM, Serum: 97 mg/dL (ref 52–322)
Total Protein, Serum Electrophoresis: 6.9 g/dL (ref 6.0–8.3)

## 2013-11-14 LAB — CERULOPLASMIN: Ceruloplasmin: 28 mg/dL (ref 18–53)

## 2013-11-14 LAB — COPPER, SERUM: COPPER: 112 ug/dL (ref 70–175)

## 2013-11-17 LAB — UIFE/LIGHT CHAINS/TP QN, 24-HR UR
Free Kappa Lt Chains,Ur: 0.12 mg/dL — ABNORMAL LOW (ref 0.14–2.42)
Free Kappa/Lambda Ratio: 4 ratio (ref 2.04–10.37)
Free Lambda Lt Chains,Ur: <0.03 mg/dL (ref 0.02–0.67)
Total Protein, Urine: 0.7 mg/dL

## 2013-11-26 ENCOUNTER — Encounter: Payer: Self-pay | Admitting: Neurology

## 2013-11-26 ENCOUNTER — Other Ambulatory Visit: Payer: BC Managed Care – PPO

## 2013-11-26 ENCOUNTER — Encounter: Payer: Self-pay | Admitting: Family Medicine

## 2013-11-26 DIAGNOSIS — R209 Unspecified disturbances of skin sensation: Secondary | ICD-10-CM

## 2013-11-26 DIAGNOSIS — R202 Paresthesia of skin: Secondary | ICD-10-CM

## 2013-11-26 LAB — GLUCOSE TOLERANCE, 2 HOURS
GLUCOSE 1 HOUR GTT: 204 mg/dL
GLUCOSE, 2 HOUR: 172 mg/dL
GLUCOSE, FASTING: 89 mg/dL (ref 70–99)

## 2013-12-01 ENCOUNTER — Telehealth: Payer: Self-pay | Admitting: Family Medicine

## 2013-12-01 NOTE — Telephone Encounter (Signed)
FYI

## 2013-12-01 NOTE — Telephone Encounter (Signed)
Patient Information:  Caller Name: Cynthia Delgado  Phone: [phone]  Patient: Cynthia Delgado, Cynthia Delgado  Gender: Female  DOB: 11/08/1965  Age: 48 Years  PCP: Kelle Dartingodd, [facility](Family Practice)  Pregnant: No  Office Follow Up:  Does the office need to follow up with this patient?: No  Instructions For The Office: N/A   Symptoms  Reason For Call & Symptoms: Sore throat with swallowing; cough, nasal congestion.  Yellow sputum and greenish-yellow nasal drainage.  Emergent symptoms ruled out.  Strep Test only Visit Today or Tomorrow per Colds guideline due ot Soer throat present > 5 days.  Reviewed Health History In EMR: Yes  Reviewed Medications In EMR: Yes  Reviewed Allergies In EMR: Yes  Reviewed Surgeries / Procedures: Yes  Date of Onset of Symptoms: 11/24/2013  Treatments Tried: Alka Seltzer Cold  Treatments Tried Worked: No OB / GYN:  LMP: 11/08/2013  Guideline(Delgado) Used:  Colds  Disposition Per Guideline:   Strep Test Only Visit Today or Tomorrow  Reason For Disposition Reached:   Sore throat present > 5 days  Advice Given:  Reassurance  Colds are very common and may make you feel uncomfortable.  Treatment for Associated Symptoms of Colds:  For muscle aches, headaches, or moderate fever (more than 101 F or 38.9 C): Take acetaminophen every 4 hours.  Sore throat: Try throat lozenges, hard candy, or warm chicken broth.  Hydrate: Drink adequate liquids.  Call Back If:  Difficulty breathing occurs  Fever lasts more than 3 days  Nasal discharge lasts more than 10 days  Cough lasts more than 3 weeks  You become worse  RN Overrode Recommendation:  Document Patient  Patient requests to wait until 12/02/13 to determine if she will make appointment to see provider.  She declined to schedule appointment.

## 2013-12-02 ENCOUNTER — Ambulatory Visit (INDEPENDENT_AMBULATORY_CARE_PROVIDER_SITE_OTHER): Payer: BC Managed Care – PPO | Admitting: Family Medicine

## 2013-12-02 ENCOUNTER — Encounter: Payer: Self-pay | Admitting: Family Medicine

## 2013-12-02 ENCOUNTER — Ambulatory Visit: Payer: BC Managed Care – PPO | Admitting: Physician Assistant

## 2013-12-02 VITALS — BP 110/78 | Temp 98.7°F | Wt 115.0 lb

## 2013-12-02 DIAGNOSIS — R059 Cough, unspecified: Secondary | ICD-10-CM

## 2013-12-02 DIAGNOSIS — G629 Polyneuropathy, unspecified: Secondary | ICD-10-CM

## 2013-12-02 DIAGNOSIS — R05 Cough: Secondary | ICD-10-CM

## 2013-12-02 DIAGNOSIS — G609 Hereditary and idiopathic neuropathy, unspecified: Secondary | ICD-10-CM

## 2013-12-02 LAB — HEMOGLOBIN A1C: HEMOGLOBIN A1C: 5.4 % (ref 4.6–6.5)

## 2013-12-02 MED ORDER — HYDROCODONE-HOMATROPINE 5-1.5 MG/5ML PO SYRP: 5.0000 mL | ORAL_SOLUTION | Freq: Three times a day (TID) | ORAL | Status: DC | PRN

## 2013-12-02 NOTE — Progress Notes (Signed)
Pre visit review using our clinic review tool, if applicable. No additional management support is needed unless otherwise documented below in the visit note. 

## 2013-12-02 NOTE — Progress Notes (Signed)
   Subjective:    Patient ID: Cynthia Delgado, female    DOB: 11/14/1965, 48 y.o.   MRN: [id number]  HPI Cynthia Delgado is a 48 year old female nonsmoker who comes in today for evaluation of 2 problems  For the last 9 days she's had chest congestion sore throat and mild cough  5 days prior to the beginning of her illness she was on an airplane flight and the person behind her had a cold  She's been worked up by Dr. Allena KatzPatel because of the neuropathy. Fasting blood sugar 89 however to RPC was elevated. Let's get an A1c today    Review of Systems Review of systems otherwise negative     Objective:   Physical Exam  Well-developed well-nourished female no acute distress vital signs stable she is afebrile HEENT negative neck was supple no adenopathy lungs are clear      Assessment & Plan:Viral syndrome,,,,,,,,,,, viral syndrome  , Viral syndrome,,,,,,,,,,,, treat symptomatically with Tylenol lots of liquids and Hydromet when necessary for cough  Abnormal glucose tolerance test,,,,,,,,,,,,, check A1c

## 2013-12-02 NOTE — Patient Instructions (Signed)
Drink lots of water  Chloraseptic lozenge or  Hydromet........Marland Kitchen. 1/2-1 teaspoon at bedtime when necessary  Hemoglobin A1c today to rule out diabetes

## 2013-12-03 ENCOUNTER — Encounter: Payer: Self-pay | Admitting: Neurology

## 2013-12-22 ENCOUNTER — Other Ambulatory Visit: Payer: Self-pay | Admitting: *Deleted

## 2013-12-22 DIAGNOSIS — I82409 Acute embolism and thrombosis of unspecified deep veins of unspecified lower extremity: Secondary | ICD-10-CM

## 2013-12-22 MED ORDER — TRAMADOL HCL 50 MG PO TABS: ORAL_TABLET | ORAL | Status: DC

## 2013-12-22 NOTE — Telephone Encounter (Signed)
Rx called in 

## 2014-02-04 ENCOUNTER — Encounter: Payer: Self-pay | Admitting: Family Medicine

## 2014-02-04 MED ORDER — ALUMINUM CHLORIDE 20 % EX SOLN: CUTANEOUS | Status: DC

## 2014-02-09 MED ORDER — ALUMINUM CHLORIDE 20 % EX SOLN: CUTANEOUS | Status: DC

## 2014-02-09 NOTE — Addendum Note (Signed)
Addended by: Kern ReapVEREEN, Eytan Carrigan B on: 02/09/2014 10:30 AM   Modules accepted: Orders

## 2014-02-12 ENCOUNTER — Telehealth: Payer: Self-pay | Admitting: Neurology

## 2014-02-12 NOTE — Telephone Encounter (Signed)
Pt called to cancel her 02/13/14 f/u appt. Pt feels fine and doesn't feel like she needs to be seen.

## 2014-02-13 ENCOUNTER — Ambulatory Visit: Payer: BC Managed Care – PPO | Admitting: Neurology

## 2014-03-05 ENCOUNTER — Other Ambulatory Visit: Payer: Self-pay | Admitting: Family Medicine

## 2014-03-07 ENCOUNTER — Other Ambulatory Visit: Payer: Self-pay | Admitting: Family Medicine

## 2014-04-27 ENCOUNTER — Other Ambulatory Visit (INDEPENDENT_AMBULATORY_CARE_PROVIDER_SITE_OTHER): Payer: 59

## 2014-04-27 DIAGNOSIS — Z0000 Encounter for general adult medical examination without abnormal findings: Secondary | ICD-10-CM

## 2014-04-27 LAB — HEPATIC FUNCTION PANEL
ALBUMIN: 4.3 g/dL (ref 3.5–5.2)
ALT: 17 U/L (ref 0–35)
AST: 21 U/L (ref 0–37)
Alkaline Phosphatase: 35 U/L — ABNORMAL LOW (ref 39–117)
Bilirubin, Direct: 0.1 mg/dL (ref 0.0–0.3)
TOTAL PROTEIN: 6.9 g/dL (ref 6.0–8.3)
Total Bilirubin: 0.9 mg/dL (ref 0.2–1.2)

## 2014-04-27 LAB — LIPID PANEL
CHOLESTEROL: 241 mg/dL — AB (ref 0–200)
HDL: 79.9 mg/dL (ref >39.00)
LDL CALC: 138 mg/dL — AB (ref 0–99)
NonHDL: 161.1
TRIGLYCERIDES: 115 mg/dL (ref 0.0–149.0)
Total CHOL/HDL Ratio: 3
VLDL: 23 mg/dL (ref 0.0–40.0)

## 2014-04-27 LAB — POCT URINALYSIS DIPSTICK
BILIRUBIN UA: NEGATIVE
Blood, UA: NEGATIVE
GLUCOSE UA: NEGATIVE
Ketones, UA: NEGATIVE
Leukocytes, UA: NEGATIVE
Nitrite, UA: NEGATIVE
Protein, UA: NEGATIVE
Spec Grav, UA: 1.01
Urobilinogen, UA: 0.2
pH, UA: 7

## 2014-04-27 LAB — CBC WITH DIFFERENTIAL/PLATELET
BASOS ABS: 0 10*3/uL (ref 0.0–0.1)
BASOS PCT: 0.5 % (ref 0.0–3.0)
EOS ABS: 0.1 10*3/uL (ref 0.0–0.7)
Eosinophils Relative: 1.9 % (ref 0.0–5.0)
HCT: 39.7 % (ref 36.0–46.0)
Hemoglobin: 13.3 g/dL (ref 12.0–15.0)
LYMPHS PCT: 21.8 % (ref 12.0–46.0)
Lymphs Abs: 1.7 10*3/uL (ref 0.7–4.0)
MCHC: 33.5 g/dL (ref 30.0–36.0)
MCV: 98.2 fl (ref 78.0–100.0)
Monocytes Absolute: 0.7 10*3/uL (ref 0.1–1.0)
Monocytes Relative: 9 % (ref 3.0–12.0)
NEUTROS PCT: 66.8 % (ref 43.0–77.0)
Neutro Abs: 5.3 10*3/uL (ref 1.4–7.7)
Platelets: 286 10*3/uL (ref 150.0–400.0)
RBC: 4.04 Mil/uL (ref 3.87–5.11)
RDW: 12.4 % (ref 11.5–15.5)
WBC: 7.9 10*3/uL (ref 4.0–10.5)

## 2014-04-27 LAB — BASIC METABOLIC PANEL
BUN: 13 mg/dL (ref 6–23)
CALCIUM: 9.8 mg/dL (ref 8.4–10.5)
CO2: 28 meq/L (ref 19–32)
CREATININE: 0.7 mg/dL (ref 0.4–1.2)
Chloride: 102 mEq/L (ref 96–112)
GFR: 95.23 mL/min (ref >60.00)
Glucose, Bld: 101 mg/dL — ABNORMAL HIGH (ref 70–99)
Potassium: 5.4 mEq/L — ABNORMAL HIGH (ref 3.5–5.1)
Sodium: 139 mEq/L (ref 135–145)

## 2014-04-27 LAB — TSH: TSH: 1.28 u[IU]/mL (ref 0.35–4.50)

## 2014-04-28 ENCOUNTER — Other Ambulatory Visit: Payer: BC Managed Care – PPO

## 2014-05-05 ENCOUNTER — Other Ambulatory Visit (HOSPITAL_COMMUNITY)
Admission: RE | Admit: 2014-05-05 | Discharge: 2014-05-05 | Disposition: A | Discharge status: Home / Self Care | Payer: 59 | Source: Ambulatory Visit | Attending: Family Medicine | Admitting: Family Medicine

## 2014-05-05 ENCOUNTER — Encounter: Payer: Self-pay | Admitting: Family Medicine

## 2014-05-05 ENCOUNTER — Ambulatory Visit (INDEPENDENT_AMBULATORY_CARE_PROVIDER_SITE_OTHER): Payer: 59 | Admitting: Family Medicine

## 2014-05-05 VITALS — BP 98/68 | Temp 97.6°F | Ht 59.0 in | Wt 109.0 lb

## 2014-05-05 DIAGNOSIS — R002 Palpitations: Secondary | ICD-10-CM

## 2014-05-05 DIAGNOSIS — Z01419 Encounter for gynecological examination (general) (routine) without abnormal findings: Secondary | ICD-10-CM | POA: Diagnosis not present

## 2014-05-05 DIAGNOSIS — Z1151 Encounter for screening for human papillomavirus (HPV): Secondary | ICD-10-CM | POA: Diagnosis present

## 2014-05-05 DIAGNOSIS — Z0000 Encounter for general adult medical examination without abnormal findings: Secondary | ICD-10-CM

## 2014-05-05 DIAGNOSIS — F410 Panic disorder [episodic paroxysmal anxiety] without agoraphobia: Secondary | ICD-10-CM

## 2014-05-05 DIAGNOSIS — M545 Low back pain, unspecified: Secondary | ICD-10-CM

## 2014-05-05 DIAGNOSIS — N938 Other specified abnormal uterine and vaginal bleeding: Secondary | ICD-10-CM

## 2014-05-05 MED ORDER — NADOLOL 20 MG PO TABS: 20.0000 mg | ORAL_TABLET | Freq: Two times a day (BID) | ORAL | Status: AC | PRN

## 2014-05-05 NOTE — Patient Instructions (Signed)
Continue your current medications  Milk of magnesia 2 tablespoons twice daily and prune juice 4 ounces once daily in the morning......... 32 ounces of water daily....... Walking,,,,,, fruit.  Return in one year sooner if any problems

## 2014-05-05 NOTE — Progress Notes (Signed)
Pre visit review using our clinic review tool, if applicable. No additional management support is needed unless otherwise documented below in the visit note. 

## 2014-05-05 NOTE — Progress Notes (Signed)
   Subjective:    Patient ID: Vista DeckNancy S Delgado, female    DOB: 09/08/1965, 49 y.o.   MRN: [id number]  HPI Carmelia RollerSELENE is a 49 year old single female nonsmoker who comes in today for general physical examination  She uses Drysol for hyperhidrosis, Klonopin 0.5 daily at bedtime for anxiety and panic type attacks, Corgard 20 mg twice a day for palpitations and migraine headaches and tramadol 50 mg 1/2-1 tablet at bedtime when necessary for low back pain  She says overall she feels well except she's beginning to have menopausal type symptoms. Last normal. Was in October. In December she had some spotting. She's not a candidate for HRT because she had a PE.  She has low back and neck pain advised take Motrin 400 twice a day  She says she has a neuropathy etiology of unknown and takes folic acid  She has constipation which manifests itself by a bowel movement every 4-5 days. She's evening over-the-counter natural laxative and it seems to be working.  Advise routine eye care, dental care, BSE monthly, no family history of colon cancer polyps therefore colonoscopy at age 49  Vaccinations updated by Fleet Contrasachel   Review of Systems  Constitutional: Negative.   HENT: Negative.   Eyes: Negative.   Respiratory: Negative.   Cardiovascular: Negative.   Gastrointestinal: Negative.   Endocrine: Negative.   Genitourinary: Negative.   Musculoskeletal: Negative.   Skin: Negative.   Allergic/Immunologic: Negative.   Neurological: Negative.   Hematological: Negative.   Psychiatric/Behavioral: Negative.        Objective:   Physical Exam  Constitutional: She appears well-developed and well-nourished.  HENT:  Head: Normocephalic and atraumatic.  Right Ear: External ear normal.  Left Ear: External ear normal.  Nose: Nose normal.  Mouth/Throat: Oropharynx is clear and moist.  Eyes: EOM are normal. Pupils are equal, round, and reactive to light.  Neck: Normal range of motion. Neck supple. No JVD present. No  tracheal deviation present. No thyromegaly present.  Cardiovascular: Normal rate, regular rhythm, normal heart sounds and intact distal pulses.  Exam reveals no gallop and no friction rub.   No murmur heard. Pulmonary/Chest: Effort normal and breath sounds normal. No stridor. No respiratory distress. She has no wheezes. She has no rales. She exhibits no tenderness.  Abdominal: Soft. Bowel sounds are normal. She exhibits no distension and no mass. There is no tenderness. There is no rebound and no guarding.  Genitourinary: Vagina normal and uterus normal. Guaiac negative stool. No vaginal discharge found.  Musculoskeletal: Normal range of motion.  Lymphadenopathy:    She has no cervical adenopathy.  Neurological: She is alert. She has normal reflexes. No cranial nerve deficit. She exhibits normal muscle tone. Coordination normal.  Skin: Skin is warm and dry. No rash noted. No erythema. No pallor.  Psychiatric: She has a normal mood and affect. Her behavior is normal. Judgment and thought content normal.  Nursing note and vitals reviewed.         Assessment & Plan:  Healthy female  History of anxiety panic attacks Klonopin 0.5 daily at bedtime when necessary  Palpitations Corgard 20 mg twice a day  Chronic low back pain and neck pain tramadol 50 mg 1/2-1 tablet at bedtime when necessary  Perimenopausal symptoms observe.......... 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

## 2014-05-07 LAB — CYTOLOGY - PAP

## 2014-06-17 ENCOUNTER — Encounter: Payer: Self-pay | Admitting: Family Medicine

## 2014-06-17 ENCOUNTER — Other Ambulatory Visit: Payer: Self-pay | Admitting: *Deleted

## 2014-06-17 MED ORDER — SERTRALINE HCL 25 MG PO TABS: 25.0000 mg | ORAL_TABLET | Freq: Every day | ORAL | Status: AC

## 2014-09-01 ENCOUNTER — Other Ambulatory Visit: Payer: Self-pay

## 2014-09-01 DIAGNOSIS — Z1231 Encounter for screening mammogram for malignant neoplasm of breast: Secondary | ICD-10-CM

## 2014-10-09 ENCOUNTER — Ambulatory Visit: Payer: 59

## 2014-10-29 ENCOUNTER — Ambulatory Visit
Admission: RE | Admit: 2014-10-29 | Discharge: 2014-10-29 | Disposition: A | Discharge status: Home / Self Care | Payer: 59 | Source: Ambulatory Visit

## 2014-10-29 DIAGNOSIS — Z1231 Encounter for screening mammogram for malignant neoplasm of breast: Secondary | ICD-10-CM

## 2014-11-02 ENCOUNTER — Encounter: Payer: Self-pay | Admitting: Family Medicine

## 2014-11-02 MED ORDER — CLONAZEPAM 0.5 MG PO TABS: 0.5000 mg | ORAL_TABLET | Freq: Three times a day (TID) | ORAL | Status: AC | PRN

## 2015-01-21 ENCOUNTER — Encounter: Payer: Self-pay | Admitting: Family Medicine

## 2015-01-22 ENCOUNTER — Other Ambulatory Visit: Payer: Self-pay | Admitting: Family Medicine

## 2015-03-08 ENCOUNTER — Encounter: Payer: Self-pay | Admitting: Family Medicine

## 2015-03-09 ENCOUNTER — Encounter: Payer: Self-pay | Admitting: Family Medicine

## 2015-03-09 ENCOUNTER — Ambulatory Visit (INDEPENDENT_AMBULATORY_CARE_PROVIDER_SITE_OTHER): Payer: 59 | Admitting: Family Medicine

## 2015-03-09 VITALS — BP 100/68 | HR 60 | Temp 98.3°F | Wt 113.0 lb

## 2015-03-09 DIAGNOSIS — R002 Palpitations: Secondary | ICD-10-CM

## 2015-03-09 NOTE — Patient Instructions (Signed)
Hold the nadolol Wednesday Thursday Friday........Marland Kitchen. restart on Saturday evening by taking a half a tab.......Marland Kitchen. 10 mg......... at bedtime  Check your blood pressure and pulse daily............: 2 weeks leave a message with Fleet ContrasRachel extension [phone]....... and we will call you back and discuss your numbers

## 2015-03-09 NOTE — Progress Notes (Signed)
   Subjective:    Patient ID: Cynthia Delgado, female    DOB: 07/04/1965, 49 y.o.   MRN: [id number]  HPI Cynthia Delgado is a 49 year old single female nonsmoker who comes in today for evaluation of low heart rate  She has been on Corgard 20 mg twice a day because a history of palpitations. Most recently she's noticed her pulse dropped below 50 and her BP is dropped below her 100. She felt like it was probably a medicine side effects over the weekend she stopped her medication. She restarted the Corgard on Monday but only 20 mg once daily.  We spent a fair amount of time going over the other options.   Review of Systems    review of systems otherwise negative Objective:   Physical Exam  Well-developed well-nourished female no acute distress vital signs stable she's afebrile pulse is 70 and regular      Assessment & Plan:  Palpitations now with side effect of bradycardia from the Corgard........Marland Kitchen. recommended and told the Corgard for 3 days........Marland Kitchen. restart on Saturday at 10 mg daily call us in 2 weeks with a progress report.

## 2015-03-09 NOTE — Progress Notes (Signed)
Pre visit review using our clinic review tool, if applicable. No additional management support is needed unless otherwise documented below in the visit note. 

## 2015-03-23 ENCOUNTER — Encounter: Payer: Self-pay | Admitting: Family Medicine

## 2015-05-04 ENCOUNTER — Ambulatory Visit: Payer: 59 | Admitting: Family Medicine

## 2015-05-10 ENCOUNTER — Encounter: Payer: Self-pay | Admitting: Family Medicine

## 2015-05-10 ENCOUNTER — Ambulatory Visit (INDEPENDENT_AMBULATORY_CARE_PROVIDER_SITE_OTHER): Payer: 59 | Admitting: Family Medicine

## 2015-05-10 VITALS — BP 110/70 | HR 65 | Temp 98.4°F | Wt 115.9 lb

## 2015-05-10 DIAGNOSIS — R002 Palpitations: Secondary | ICD-10-CM | POA: Diagnosis not present

## 2015-05-10 NOTE — Progress Notes (Signed)
Pre visit review using our clinic review tool, if applicable. No additional management support is needed unless otherwise documented below in the visit note. 

## 2015-05-10 NOTE — Patient Instructions (Signed)
Corgard 20 mg.......... One half tablet Monday Wednesday Friday   Set up a time in February or March............ 30 minutes....... General physical exam  Rachel's extension is [phone]

## 2015-05-10 NOTE — Progress Notes (Signed)
   Subjective:    Patient ID: Vista DeckNancy S Mccuen, female    DOB: 04/08/1966, 50 y.o.   MRN: [id number]  HPI   Carmelia RollerSelene is a 50 year old female nonsmoker comes in today for follow-up palpitations  She has history of palpitations. We initially started on beta blocker 20 mg twice a day. This stop the palpitations but she had side effects of bradycardia. We therefore of taper down her data blocker to 10 mg Monday Wednesday Friday. Her heart rate runs around 60-70. No palpitations except for small occasions now maybe once a week that short-lived.  Review of Systems  review of systems negative    Objective:   Physical Exam   well-developed well-nourished female no acute distress vital signs stable she's afebrile pulse 60 and regular      Assessment & Plan:   history of palpitations.......... Continue Corgard 10 mg Monday Wednesday Friday follow-up in February CPX

## 2015-08-23 ENCOUNTER — Other Ambulatory Visit: Payer: Self-pay | Admitting: Family Medicine

## 2015-10-07 ENCOUNTER — Other Ambulatory Visit: Payer: Self-pay | Admitting: Family Medicine

## 2015-10-07 DIAGNOSIS — Z1231 Encounter for screening mammogram for malignant neoplasm of breast: Secondary | ICD-10-CM

## 2015-11-16 ENCOUNTER — Ambulatory Visit
Admission: RE | Admit: 2015-11-16 | Discharge: 2015-11-16 | Disposition: A | Discharge status: Home / Self Care | Payer: 59 | Source: Ambulatory Visit | Attending: Family Medicine | Admitting: Family Medicine

## 2015-11-16 DIAGNOSIS — Z1231 Encounter for screening mammogram for malignant neoplasm of breast: Secondary | ICD-10-CM

## 2017-01-12 ENCOUNTER — Encounter: Payer: Self-pay | Admitting: Family Medicine

## 2017-04-09 ENCOUNTER — Other Ambulatory Visit: Payer: Self-pay | Admitting: Family Medicine

## 2017-04-09 DIAGNOSIS — Z1231 Encounter for screening mammogram for malignant neoplasm of breast: Secondary | ICD-10-CM

## 2017-04-12 ENCOUNTER — Ambulatory Visit
Admission: RE | Admit: 2017-04-12 | Discharge: 2017-04-12 | Disposition: A | Discharge status: Home / Self Care | Payer: 59 | Source: Ambulatory Visit | Attending: Family Medicine | Admitting: Family Medicine

## 2017-04-12 DIAGNOSIS — Z1231 Encounter for screening mammogram for malignant neoplasm of breast: Secondary | ICD-10-CM

## 2018-05-13 ENCOUNTER — Other Ambulatory Visit: Payer: Self-pay | Admitting: Family Medicine

## 2018-05-13 DIAGNOSIS — Z1231 Encounter for screening mammogram for malignant neoplasm of breast: Secondary | ICD-10-CM

## 2018-06-14 ENCOUNTER — Ambulatory Visit
Admission: RE | Admit: 2018-06-14 | Discharge: 2018-06-14 | Disposition: A | Discharge status: Home / Self Care | Payer: Self-pay | Source: Ambulatory Visit | Attending: Family Medicine | Admitting: Family Medicine

## 2018-06-14 ENCOUNTER — Ambulatory Visit: Payer: 59

## 2018-06-14 DIAGNOSIS — Z1231 Encounter for screening mammogram for malignant neoplasm of breast: Secondary | ICD-10-CM

## 2019-05-28 ENCOUNTER — Other Ambulatory Visit: Payer: Self-pay | Admitting: Family Medicine

## 2019-05-28 DIAGNOSIS — Z1231 Encounter for screening mammogram for malignant neoplasm of breast: Secondary | ICD-10-CM

## 2019-07-09 ENCOUNTER — Other Ambulatory Visit: Payer: Self-pay

## 2019-07-09 ENCOUNTER — Ambulatory Visit
Admission: RE | Admit: 2019-07-09 | Discharge: 2019-07-09 | Disposition: A | Discharge status: Home / Self Care | Payer: Managed Care, Other (non HMO) | Source: Ambulatory Visit | Attending: Family Medicine | Admitting: Family Medicine

## 2019-07-09 DIAGNOSIS — Z1231 Encounter for screening mammogram for malignant neoplasm of breast: Secondary | ICD-10-CM

## 2020-06-07 ENCOUNTER — Other Ambulatory Visit: Payer: Self-pay | Admitting: Family Medicine

## 2020-06-07 DIAGNOSIS — Z1231 Encounter for screening mammogram for malignant neoplasm of breast: Secondary | ICD-10-CM

## 2020-07-09 ENCOUNTER — Inpatient Hospital Stay: Admission: RE | Admit: 2020-07-09 | Payer: Managed Care, Other (non HMO) | Source: Ambulatory Visit

## 2020-07-09 DIAGNOSIS — Z1231 Encounter for screening mammogram for malignant neoplasm of breast: Secondary | ICD-10-CM

## 2020-09-01 ENCOUNTER — Other Ambulatory Visit: Payer: Self-pay

## 2020-09-01 ENCOUNTER — Ambulatory Visit
Admission: RE | Admit: 2020-09-01 | Discharge: 2020-09-01 | Disposition: A | Discharge status: Home / Self Care | Payer: Managed Care, Other (non HMO) | Source: Ambulatory Visit | Attending: Family Medicine | Admitting: Family Medicine

## 2020-09-01 DIAGNOSIS — Z1231 Encounter for screening mammogram for malignant neoplasm of breast: Secondary | ICD-10-CM

## 2021-10-08 IMAGING — MG MM DIGITAL SCREENING BILAT W/ TOMO AND CAD
8 series · 9 of 24 positions shown · non-contrast
Comparison: Previous exam(s).

CLINICAL DATA: Screening.

EXAM:
DIGITAL SCREENING BILATERAL MAMMOGRAM WITH TOMOSYNTHESIS AND CAD
TECHNIQUE: Bilateral screening digital craniocaudal and mediolateral oblique
mammograms were obtained. Bilateral screening digital breast
tomosynthesis was performed. The images were evaluated with
computer-aided detection.

[L MLO synth-2D]
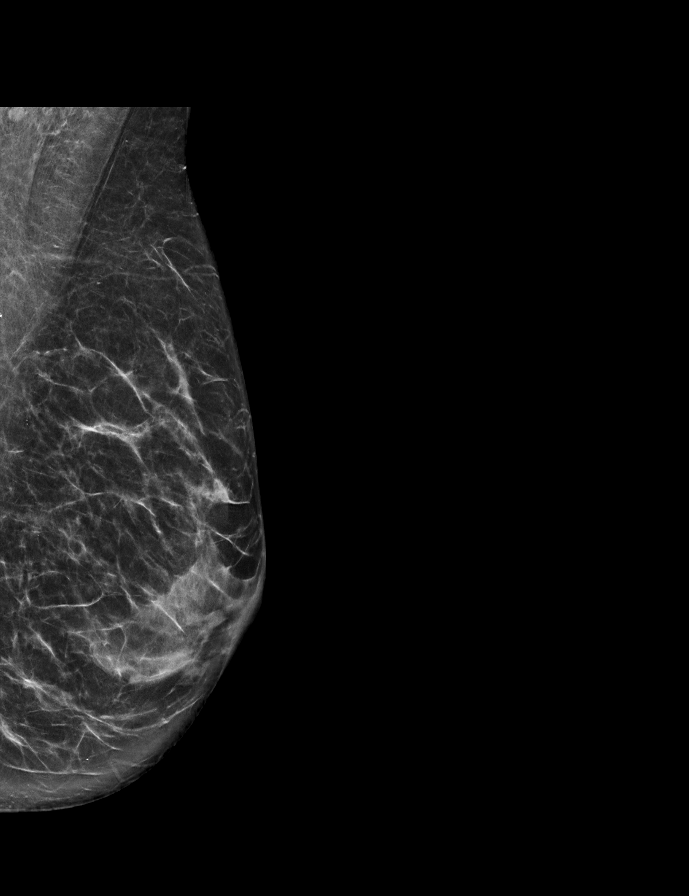

[R MLO synth-2D]
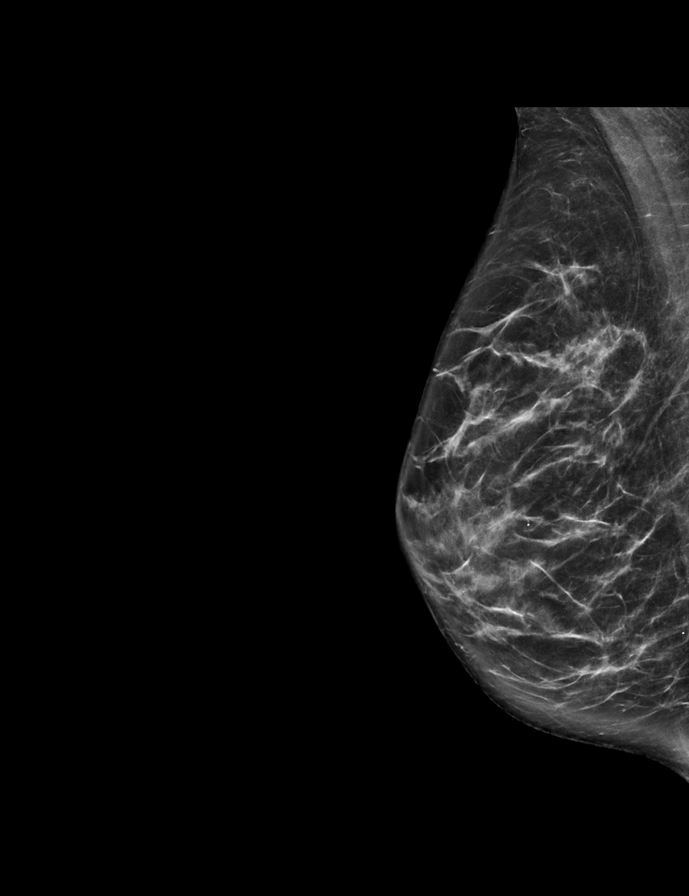

[L CC synth-2D]
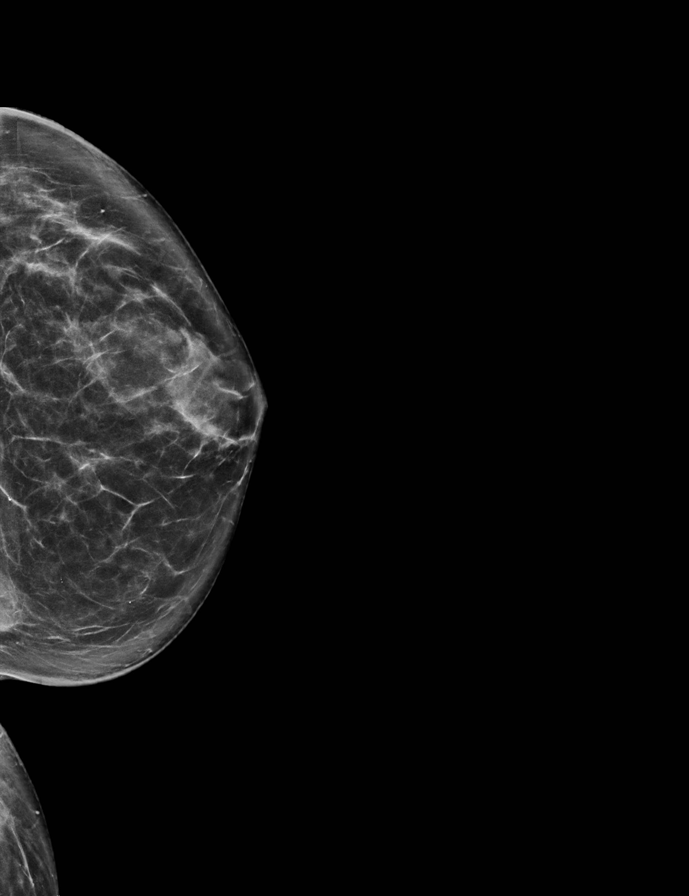

[R CC synth-2D]
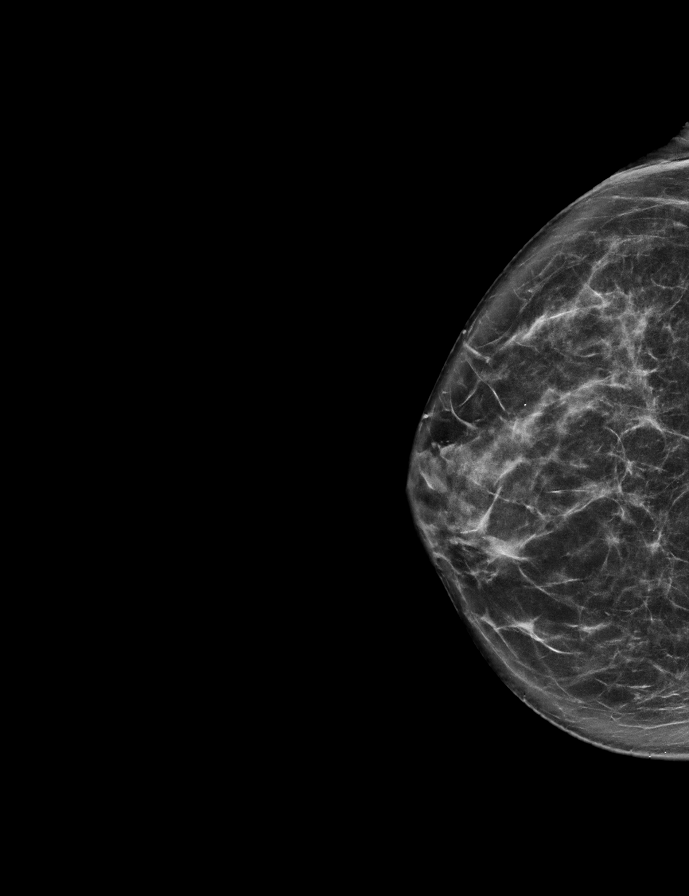

[R CC tomo · 2 of 67 frames shown]
[frame 22/67]
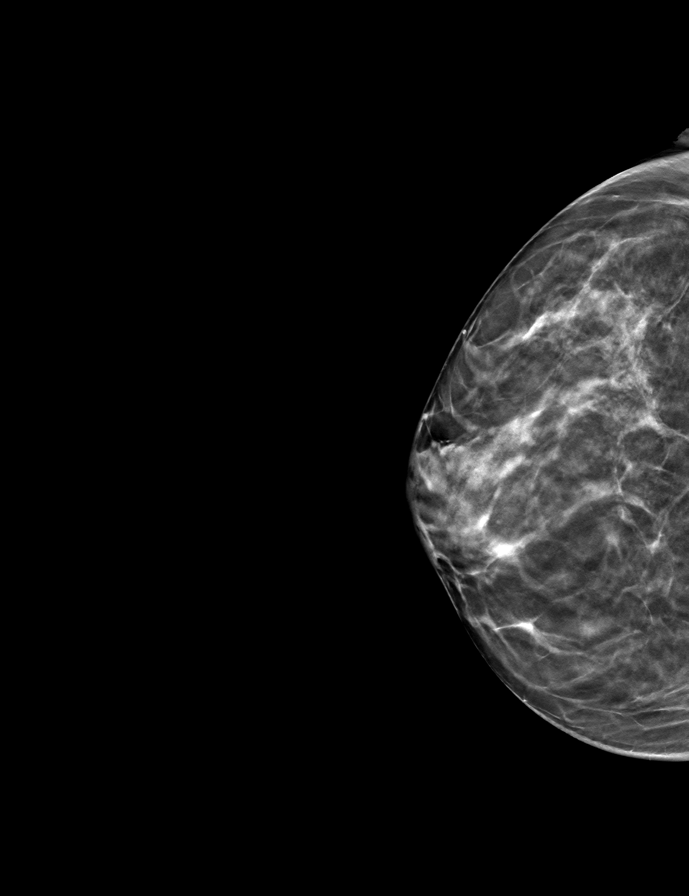
[frame 34/67]
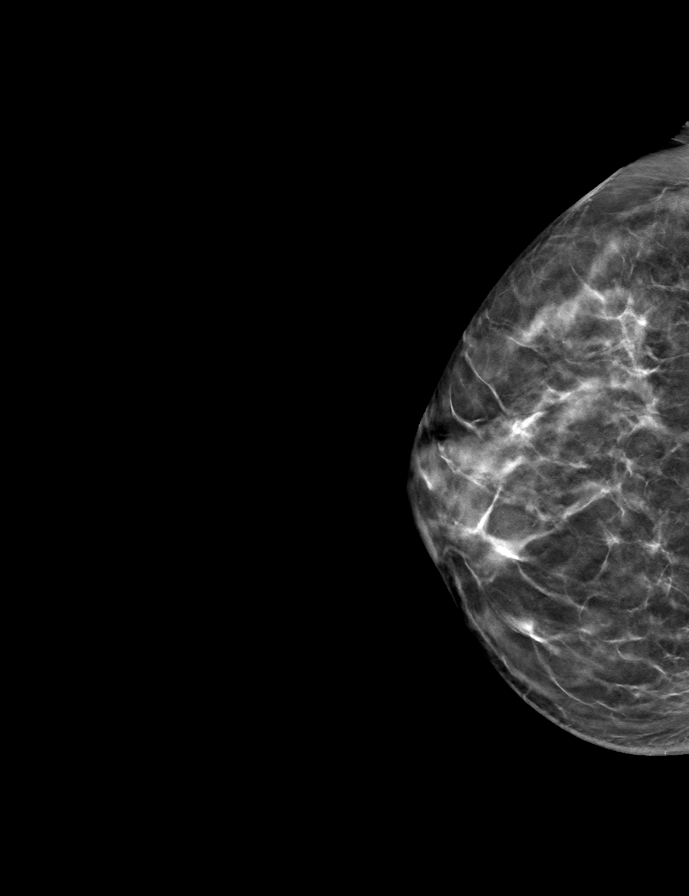

[R MLO tomo · tomo slice 35/68.0]
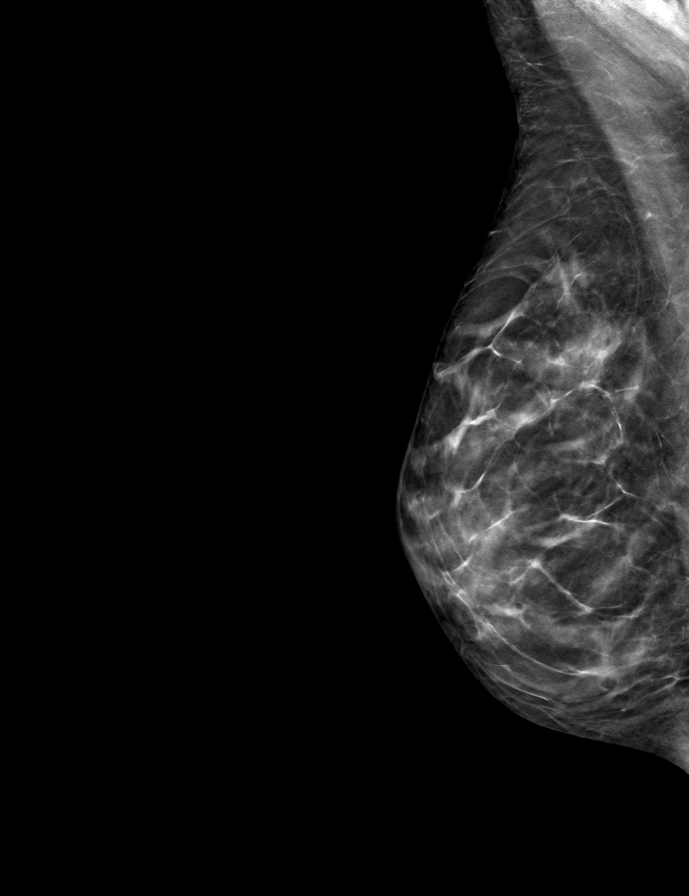

[L MLO tomo · tomo slice 33/65.0]
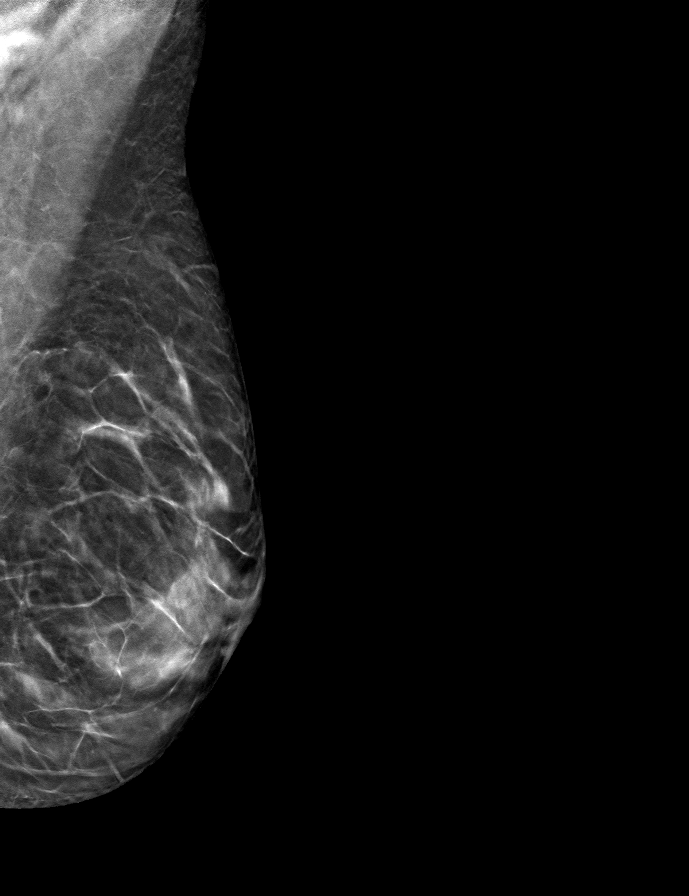

[L CC tomo · tomo slice 27/53.0]
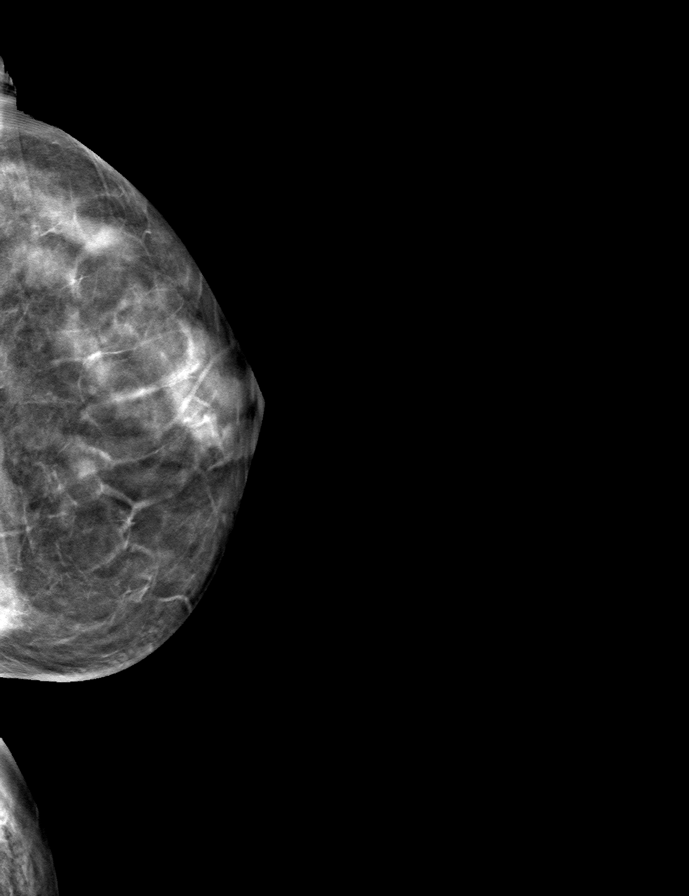

[9 of 24 positions shown; findings below may reference images not displayed]

ACR Breast Density Category b: There are scattered areas of
fibroglandular density.
FINDINGS: There are no findings suspicious for malignancy. The images were
evaluated with computer-aided detection.
IMPRESSION: No mammographic evidence of malignancy. A result letter of this
screening mammogram will be mailed directly to the patient.

RECOMMENDATION:
Screening mammogram in one year. (Code:WJ-I-BG6)

BI-RADS CATEGORY  1: Negative.
